# Patient Record
Sex: Female | Born: 1970 | Race: Black or African American | Hispanic: No | Marital: Married | Smoking: Never smoker
Health system: Southern US, Community
[De-identification: ages and names within clinical notes are randomized; demographics above are authoritative.]

## PROBLEM LIST (undated history)

## (undated) DIAGNOSIS — L309 Dermatitis, unspecified: Secondary | ICD-10-CM

## (undated) DIAGNOSIS — T7840XA Allergy, unspecified, initial encounter: Secondary | ICD-10-CM

## (undated) DIAGNOSIS — J45909 Unspecified asthma, uncomplicated: Secondary | ICD-10-CM

## (undated) DIAGNOSIS — E119 Type 2 diabetes mellitus without complications: Secondary | ICD-10-CM

## (undated) DIAGNOSIS — M549 Dorsalgia, unspecified: Secondary | ICD-10-CM

## (undated) HISTORY — DX: Dermatitis, unspecified: L30.9

## (undated) HISTORY — DX: Dorsalgia, unspecified: M54.9

## (undated) HISTORY — PX: CHOLECYSTECTOMY: SHX55

## (undated) HISTORY — DX: Type 2 diabetes mellitus without complications: E11.9

## (undated) HISTORY — PX: TUBAL LIGATION: SHX77

## (undated) HISTORY — DX: Allergy, unspecified, initial encounter: T78.40XA

## (undated) HISTORY — PX: BREAST BIOPSY: SHX20

## (undated) HISTORY — PX: ABDOMINAL HYSTERECTOMY: SHX81

---

## 2020-01-28 DIAGNOSIS — J4520 Mild intermittent asthma, uncomplicated: Secondary | ICD-10-CM | POA: Insufficient documentation

## 2020-12-09 DIAGNOSIS — E1165 Type 2 diabetes mellitus with hyperglycemia: Secondary | ICD-10-CM | POA: Insufficient documentation

## 2021-10-14 ENCOUNTER — Other Ambulatory Visit (HOSPITAL_COMMUNITY): Payer: Self-pay

## 2021-10-14 MED ORDER — OZEMPIC (0.25 OR 0.5 MG/DOSE) 2 MG/1.5ML ~~LOC~~ SOPN
PEN_INJECTOR | SUBCUTANEOUS | 3 refills | Status: DC
  Filled 2021-10-14: qty 4.5, 30d supply, fill #0
  Filled 2021-10-17 – 2021-11-22 (×3): qty 1.5, 28d supply, fill #0

## 2021-10-17 ENCOUNTER — Other Ambulatory Visit (HOSPITAL_COMMUNITY): Payer: Self-pay

## 2021-10-18 ENCOUNTER — Other Ambulatory Visit (HOSPITAL_COMMUNITY): Payer: Self-pay

## 2021-10-19 ENCOUNTER — Other Ambulatory Visit (HOSPITAL_COMMUNITY): Payer: Self-pay

## 2021-10-24 ENCOUNTER — Other Ambulatory Visit (HOSPITAL_COMMUNITY): Payer: Self-pay

## 2021-10-24 MED ORDER — GLIPIZIDE 5 MG PO TABS
ORAL_TABLET | ORAL | 3 refills | Status: DC
  Filled 2021-10-24: qty 45, 90d supply, fill #0

## 2021-10-25 ENCOUNTER — Other Ambulatory Visit (HOSPITAL_COMMUNITY): Payer: Self-pay

## 2021-10-26 ENCOUNTER — Other Ambulatory Visit (HOSPITAL_COMMUNITY): Payer: Self-pay

## 2021-10-29 ENCOUNTER — Other Ambulatory Visit (HOSPITAL_COMMUNITY): Payer: Self-pay

## 2021-11-05 ENCOUNTER — Other Ambulatory Visit (HOSPITAL_COMMUNITY): Payer: Self-pay

## 2021-11-08 ENCOUNTER — Other Ambulatory Visit (HOSPITAL_COMMUNITY): Payer: Self-pay

## 2021-11-22 ENCOUNTER — Other Ambulatory Visit (HOSPITAL_COMMUNITY): Payer: Self-pay

## 2021-11-23 ENCOUNTER — Other Ambulatory Visit: Payer: Self-pay

## 2021-11-23 ENCOUNTER — Other Ambulatory Visit (HOSPITAL_COMMUNITY): Payer: Self-pay

## 2021-11-23 MED ORDER — NAPROXEN 500 MG PO TABS
ORAL_TABLET | ORAL | 1 refills | Status: DC
  Filled 2021-11-23 (×2): qty 20, 10d supply, fill #0
  Filled 2022-04-05: qty 20, 10d supply, fill #1

## 2021-11-23 MED ORDER — AZITHROMYCIN 250 MG PO TABS
ORAL_TABLET | ORAL | 0 refills | Status: DC
  Filled 2021-11-23 (×2): qty 6, 5d supply, fill #0

## 2021-11-23 MED ORDER — CYCLOBENZAPRINE HCL 5 MG PO TABS
ORAL_TABLET | ORAL | 0 refills | Status: AC
  Filled 2021-11-23 (×2): qty 30, 10d supply, fill #0

## 2021-12-06 LAB — HM DIABETES EYE EXAM

## 2021-12-22 ENCOUNTER — Encounter: Payer: Self-pay | Admitting: Internal Medicine

## 2021-12-22 ENCOUNTER — Ambulatory Visit: Payer: 59 | Admitting: Internal Medicine

## 2021-12-22 ENCOUNTER — Other Ambulatory Visit: Payer: Self-pay

## 2021-12-22 VITALS — BP 132/86 | HR 110 | Temp 98.4°F | Resp 16 | Ht 64.0 in | Wt 199.1 lb

## 2021-12-22 DIAGNOSIS — B3731 Acute candidiasis of vulva and vagina: Secondary | ICD-10-CM

## 2021-12-22 DIAGNOSIS — Z1211 Encounter for screening for malignant neoplasm of colon: Secondary | ICD-10-CM | POA: Diagnosis not present

## 2021-12-22 DIAGNOSIS — E1165 Type 2 diabetes mellitus with hyperglycemia: Secondary | ICD-10-CM | POA: Diagnosis not present

## 2021-12-22 DIAGNOSIS — Z114 Encounter for screening for human immunodeficiency virus [HIV]: Secondary | ICD-10-CM

## 2021-12-22 DIAGNOSIS — Z1159 Encounter for screening for other viral diseases: Secondary | ICD-10-CM

## 2021-12-22 DIAGNOSIS — J302 Other seasonal allergic rhinitis: Secondary | ICD-10-CM

## 2021-12-22 DIAGNOSIS — E782 Mixed hyperlipidemia: Secondary | ICD-10-CM

## 2021-12-22 DIAGNOSIS — Z1231 Encounter for screening mammogram for malignant neoplasm of breast: Secondary | ICD-10-CM

## 2021-12-22 MED ORDER — FLUCONAZOLE 150 MG PO TABS
150.0000 mg | ORAL_TABLET | Freq: Once | ORAL | 0 refills | Status: AC
  Filled 2021-12-22: qty 3, 3d supply, fill #0

## 2021-12-22 MED ORDER — OZEMPIC (0.25 OR 0.5 MG/DOSE) 2 MG/1.5ML ~~LOC~~ SOPN
PEN_INJECTOR | SUBCUTANEOUS | 3 refills | Status: DC
  Filled 2021-12-22: qty 1.5, 28d supply, fill #0
  Filled 2022-02-02: qty 1.5, 28d supply, fill #1

## 2021-12-22 NOTE — Assessment & Plan Note (Signed)
Stable, continue current medications.  

## 2021-12-22 NOTE — Assessment & Plan Note (Signed)
Refilled Ozempic at 0.5 mg dose, no hypoglycemia. Plan to recheck A1c next week.  ?

## 2021-12-22 NOTE — Progress Notes (Signed)
? ?New Patient Office Visit ? ?Subjective:  ?Patient ID: Helen Rackracy Goucher, female    DOB: [birth year]  Age: 51 y.o. MRN: [id number] ? ?CC:  ?Chief Complaint  ?Patient presents with  ? Establish Care  ? Diabetes  ? Vaginitis  ?  On outside of vagina  ? ? ?HPI ?Helen Rodgers presents as a new patient.  ? ?Diabetes, Type 2: ?-First diagnosed about 1 year ago ?-Last A1c 12/22 5.4 ?-Medications: Ozempic 0.5, started in August 2022. Didn't tolerate Metformin or Glipizide (had a lot of abdominal pain and bloating). ?-Patient is compliant with the above medications and reports no side effects.  ?-Checking BG at home: post-prandial 120's usually. Lowest 90  ?-Exercise: walks, works out on elliptical  ?-Diet: hard boiled eggs, fruit, small portions  ?-Eye exam: Follows in [place], last exam 2 weeks  ?-Foot exam: UTD in December, 2022 ?-Microalbumin: UTD 7/22 ?-Statin: None ?-PNA vaccine: 23 in 2022 ?-Denies symptoms of hypoglycemia, polyuria, polydipsia, numbness extremities, foot ulcers/trauma.  ? ?HLD: ?-Medications: None ?-Last lipid panel: 12/22 TC 217, triglycerides 99, HDL 40, LDL 157  ? ?The 10-year ASCVD risk score (Arnett DK, et al., 2019) is: 7.3% ?  Values used to calculate the score: ?    Age: 20 years ?    Sex: Female ?    Is Non-Hispanic African American: Yes ?    Diabetic: Yes ?    Tobacco smoker: No ?    Systolic Blood Pressure: 132 mmHg ?    Is BP treated: No ?    HDL Cholesterol: 40 mg/dL ?    Total Cholesterol: 217 mg/dL ? ?Seasonal Allergies: ?-Currently taking Astelin daily, has Flonase PRN ?-Not on any oral anti-histamines ?-Does have associated vertigo with ear pressure, changes in hearing and tinnitus. Did see ENT ? ?Asthma:  ?-Asthma status: stable ?-Current Treatments: Albuterol PRN ?-Satisfied with current treatment?: yes ?-Albuterol/rescue inhaler frequency: maybe once a month  ?-Visits to ER or Urgent Care in past year: no ?-Pneumovax: Up to Date ?-Influenza: Up to Date ? ?Also having some vaginal pain  after intercourse, small amount of blood. Some itching on the vulvar skin, no change in vaginal discharge or vaginal pain.  ? ?Health Maintenance: ?-Blood work due  ?-Mammogram due  ?-Colon cancer screening due ? ?Past Medical History:  ?Diagnosis Date  ? Allergy   ? Back pain   ? Dermatitis of external ear   ? Diabetes mellitus without complication (HCC)   ? ? ?Past Surgical History:  ?Procedure Laterality Date  ? ABDOMINAL HYSTERECTOMY    ? CHOLECYSTECTOMY    ? TUBAL LIGATION    ? ? ?Family History  ?Problem Relation Age of Onset  ? Hyperlipidemia Mother   ? Diabetes Mother   ? Glaucoma Mother   ? Heart disease Mother   ? Hyperlipidemia Father   ? ? ?Social History  ? ?Socioeconomic History  ? Marital status: Married  ?  Spouse name: Not on file  ? Number of children: Not on file  ? Years of education: Not on file  ? Highest education level: Not on file  ?Occupational History  ? Not on file  ?Tobacco Use  ? Smoking status: Never  ? Smokeless tobacco: Never  ?Vaping Use  ? Vaping Use: Never used  ?Substance and Sexual Activity  ? Alcohol use: Yes  ?  Alcohol/week: 1.0 standard drink  ?  Types: 1 Glasses of wine per week  ?  Comment: occasional  ? Drug use: Never  ?  Sexual activity: Yes  ?Other Topics Concern  ? Not on file  ?Social History Narrative  ? Not on file  ? ?Social Determinants of Health  ? ?Financial Resource Strain: Not on file  ?Food Insecurity: Not on file  ?Transportation Needs: Not on file  ?Physical Activity: Not on file  ?Stress: Not on file  ?Social Connections: Not on file  ?Intimate Partner Violence: Not on file  ? ? ?ROS ?Review of Systems  ?Constitutional:  Negative for chills and fever.  ?Eyes:  Negative for visual disturbance.  ?Respiratory:  Negative for cough and shortness of breath.   ?Cardiovascular:  Negative for chest pain.  ?Gastrointestinal:  Negative for abdominal pain.  ?Genitourinary:  Negative for vaginal bleeding, vaginal discharge and vaginal pain.  ?Skin:  Positive for color  change.  ? ?Objective:  ? ?Today's Vitals: BP 132/86   Pulse (!) 110   Temp 98.4 ?F (36.9 ?C)   Resp 16   Ht 5\' 4"  (1.626 m)   Wt 199 lb 1.6 oz (90.3 kg)   SpO2 97%   BMI 34.18 kg/m?  ? ?Physical Exam ?Constitutional:   ?   Appearance: Normal appearance.  ?HENT:  ?   Head: Normocephalic and atraumatic.  ?Eyes:  ?   Conjunctiva/sclera: Conjunctivae normal.  ?Cardiovascular:  ?   Rate and Rhythm: Normal rate and regular rhythm.  ?Pulmonary:  ?   Effort: Pulmonary effort is normal.  ?   Breath sounds: Normal breath sounds.  ?Musculoskeletal:  ?   Right lower leg: No edema.  ?   Left lower leg: No edema.  ?Skin: ?   General: Skin is warm and dry.  ?Neurological:  ?   General: No focal deficit present.  ?   Mental Status: She is alert. Mental status is at baseline.  ?Psychiatric:     ?   Mood and Affect: Mood normal.     ?   Behavior: Behavior normal.  ? ? ?Assessment & Plan:  ? ?Problem List Items Addressed This Visit   ? ?  ? Endocrine  ? Type 2 diabetes mellitus with hyperglycemia, without long-term current use of insulin (HCC)  ?  Refilled Ozempic at 0.5 mg dose, no hypoglycemia. Plan to recheck A1c next week.  ?  ?  ? Relevant Medications  ? Semaglutide,0.25 or 0.5MG /DOS, (OZEMPIC, 0.25 OR 0.5 MG/DOSE,) 2 MG/1.5ML SOPN  ? Other Relevant Orders  ? HgB A1c  ?  ? Other  ? Mixed hyperlipidemia  ?  Reviewed last lipid panel with the patient, ASCVD risk borderline at 7.3%. Working on lifestyle changes, plan to recheck in 6 months.  ?  ?  ? Seasonal allergies  ?  Stable, continue current medications. ? ?  ?  ? ?Other Visit Diagnoses   ? ? Screening for colon cancer    -  Primary  ? Relevant Orders  ? Ambulatory referral to Gastroenterology  ? Encounter for hepatitis C screening test for low risk patient      ? Relevant Orders  ? Hepatitis C Antibody  ? Screening for HIV without presence of risk factors      ? Relevant Medications  ? fluconazole (DIFLUCAN) 150 MG tablet  ? Other Relevant Orders  ? HIV antibody (with  reflex)  ? Encounter for screening mammogram for malignant neoplasm of breast      ? Relevant Orders  ? MM Digital Screening  ? Vaginal yeast infection      ? Relevant Medications  ?  clotrimazole-betamethasone (LOTRISONE) cream  ? fluconazole (DIFLUCAN) 150 MG tablet  ? ?  ? ? ?Outpatient Encounter Medications as of 12/22/2021  ?Medication Sig  ? albuterol (VENTOLIN HFA) 108 (90 Base) MCG/ACT inhaler Inhale 1-2 puffs into the lungs every 6 (six) hours as needed.  ? azelastine (ASTELIN) 0.1 % nasal spray 1 spray into each nostril Two (2) times a day. Use in each nostril as directed  ? Chlorphen-PE-Acetaminophen 4-10-325 MG TABS Take by mouth.  ? Cholecalciferol 1.25 MG (50000 UT) capsule Monthly  ? clotrimazole-betamethasone (LOTRISONE) cream   ? cyclobenzaprine (FLEXERIL) 5 MG tablet Take 1 tablet (5 mg total) by mouth 3 (three) times daily as needed for Muscle spasms for up to 10 days  ? fluticasone (FLONASE) 50 MCG/ACT nasal spray 2 sprays.  ? ibuprofen (ADVIL) 600 MG tablet Take 600 mg by mouth every 6 (six) hours as needed.  ? naproxen (NAPROSYN) 500 MG tablet Take 1 tablet (500 mg total) by mouth 2 (two) times daily as needed for up to 10 days. Take with food.  ? Semaglutide,0.25 or 0.5MG /DOS, (OZEMPIC, 0.25 OR 0.5 MG/DOSE,) 2 MG/1.5ML SOPN Inject 0.5 mg under the skin every 7 days.  ? [DISCONTINUED] azithromycin (ZITHROMAX) 250 MG tablet Take 2 tablets on day one, then 1 tablet daily for 4 days  ? [DISCONTINUED] glipiZIDE (GLUCOTROL) 5 MG tablet Take 1/2 tablet (2.5 mg total) by mouth in the morning.  ? ?No facility-administered encounter medications on file as of 12/22/2021.  ? ? ?Follow-up: Return in about 6 months (around 06/24/2022).  ? ?Margarita Mail, DO ? ?

## 2021-12-22 NOTE — Patient Instructions (Addendum)
It was great seeing you today! ? ?Plan discussed at today's visit: ?-Blood work ordered today, results will be uploaded to MyChart. Please come next week to get labs.  ?-Continue to work on lifestyle management and exercise, decrease red meats and fatty processed foods. Plan to check cholesterol in 6 months, fast for 8-12 hours please.  ?-Take OTC Vitamin D at least 1000 IU daily  ?-Mammogram and GI referral placed  ?-Lab times 8-12 and then 2-4  ?-Diflucan sent to pharmacy, take 1 dose and can repeat dose in 24 hours if symptoms persist  ? ?Follow up in: 6 months  ? ?Take care and let us know if you have any questions or concerns prior to your next visit. ? ?Dr. Caralee Ates ? ?

## 2021-12-22 NOTE — Assessment & Plan Note (Signed)
Reviewed last lipid panel with the patient, ASCVD risk borderline at 7.3%. Working on lifestyle changes, plan to recheck in 6 months.  ?

## 2021-12-23 ENCOUNTER — Other Ambulatory Visit: Payer: Self-pay

## 2021-12-23 ENCOUNTER — Telehealth: Payer: Self-pay

## 2021-12-23 NOTE — Telephone Encounter (Signed)
CALLED PATIENT NO ANSWER LEFT VOICEMAIL FOR A CALL BACK ? ?

## 2021-12-26 ENCOUNTER — Other Ambulatory Visit: Payer: Self-pay

## 2021-12-26 DIAGNOSIS — Z1211 Encounter for screening for malignant neoplasm of colon: Secondary | ICD-10-CM

## 2021-12-26 MED ORDER — NA SULFATE-K SULFATE-MG SULF 17.5-3.13-1.6 GM/177ML PO SOLN
1.0000 | Freq: Once | ORAL | 0 refills | Status: AC
  Filled 2021-12-26: qty 354, 1d supply, fill #0

## 2021-12-26 NOTE — Progress Notes (Signed)
Gastroenterology Pre-Procedure Review ? ?Request Date: 07/21/2022 ?Requesting Physician: Dr. Marius Ditch ? ?PATIENT REVIEW QUESTIONS: The patient responded to the following health history questions as indicated:   ? ?1. Are you having any GI issues? no ?2. Do you have a personal history of Polyps? no ?3. Do you have a family history of Colon Cancer or Polyps? yes (COLON CANCER) ?4. Diabetes Mellitus? yes (TYPE 2) ?5. Joint replacements in the past 12 months?no ?6. Major health problems in the past 3 months?no ?7. Any artificial heart valves, MVP, or defibrillator?no ?   ?MEDICATIONS & ALLERGIES:    ?Patient reports the following regarding taking any anticoagulation/antiplatelet therapy:   ?Plavix, Coumadin, Eliquis, Xarelto, Lovenox, Pradaxa, Brilinta, or Effient? no ?Aspirin? yes (81MG ) ? ?Patient confirms/reports the following medications:  ?Current Outpatient Medications  ?Medication Sig Dispense Refill  ? albuterol (VENTOLIN HFA) 108 (90 Base) MCG/ACT inhaler Inhale 1-2 puffs into the lungs every 6 (six) hours as needed.    ? azelastine (ASTELIN) 0.1 % nasal spray 1 spray into each nostril Two (2) times a day. Use in each nostril as directed    ? Chlorphen-PE-Acetaminophen 4-10-325 MG TABS Take by mouth.    ? Cholecalciferol 1.25 MG (50000 UT) capsule Monthly    ? clotrimazole-betamethasone (LOTRISONE) cream     ? cyclobenzaprine (FLEXERIL) 5 MG tablet Take 1 tablet (5 mg total) by mouth 3 (three) times daily as needed for Muscle spasms for up to 10 days 30 tablet 0  ? fluconazole (DIFLUCAN) 150 MG tablet Take 1 tablet (150 mg total) by mouth once for 1 dose. 3 tablet 0  ? fluticasone (FLONASE) 50 MCG/ACT nasal spray 2 sprays.    ? ibuprofen (ADVIL) 600 MG tablet Take 600 mg by mouth every 6 (six) hours as needed.    ? naproxen (NAPROSYN) 500 MG tablet Take 1 tablet (500 mg total) by mouth 2 (two) times daily as needed for up to 10 days. Take with food. 20 tablet 1  ? Semaglutide,0.25 or 0.5MG /DOS, (OZEMPIC, 0.25 OR  0.5 MG/DOSE,) 2 MG/1.5ML SOPN Inject 0.5 mg under the skin every 7 days. 4.5 mL 3  ? ?No current facility-administered medications for this visit.  ? ? ?Patient confirms/reports the following allergies:  ?Allergies  ?Allergen Reactions  ? Sulfa Antibiotics Hives and Rash  ? Hydrocodone Other (See Comments)  ?  Vaginal swelling  ?Vaginal swelling  ?  ? ? ?No orders of the defined types were placed in this encounter. ? ? ?AUTHORIZATION INFORMATION ?Primary Insurance: ?1D#: ?Group #: ? ?Secondary Insurance: ?1D#: ?Group #: ? ?SCHEDULE INFORMATION: ?Date: 07/21/2022 ?Time: ?[place] ? ?

## 2021-12-27 ENCOUNTER — Telehealth: Payer: Self-pay

## 2021-12-27 NOTE — Telephone Encounter (Signed)
Resent my chart for patient ?

## 2022-01-02 LAB — HIV ANTIBODY (ROUTINE TESTING W REFLEX): HIV 1&2 Ab, 4th Generation: NONREACTIVE

## 2022-01-02 LAB — HEMOGLOBIN A1C
Hgb A1c MFr Bld: 6.1 % of total Hgb — ABNORMAL HIGH (ref <5.7)
Mean Plasma Glucose: 128 mg/dL
eAG (mmol/L): 7.1 mmol/L

## 2022-01-02 LAB — HEPATITIS C ANTIBODY
Hepatitis C Ab: NONREACTIVE
SIGNAL TO CUT-OFF: <0.02 (ref <1.00)

## 2022-01-04 ENCOUNTER — Ambulatory Visit: Payer: 59 | Admitting: Internal Medicine

## 2022-01-04 ENCOUNTER — Encounter: Payer: Self-pay | Admitting: Internal Medicine

## 2022-01-04 VITALS — BP 118/82 | HR 89 | Temp 97.9°F | Resp 16 | Ht 64.0 in | Wt 197.0 lb

## 2022-01-04 DIAGNOSIS — Z1231 Encounter for screening mammogram for malignant neoplasm of breast: Secondary | ICD-10-CM

## 2022-01-04 DIAGNOSIS — Z9889 Other specified postprocedural states: Secondary | ICD-10-CM

## 2022-01-04 DIAGNOSIS — N644 Mastodynia: Secondary | ICD-10-CM | POA: Diagnosis not present

## 2022-01-04 NOTE — Progress Notes (Signed)
? ?Acute Office Visit ? ?Subjective:  ? ? Patient ID: Helen Rodgers, female    DOB: Feb 23, 1971, 51 y.o.   MRN: [id number] ? ?Chief Complaint  ?Patient presents with  ? Breast Mass  ?  W/ sorness, has had abnormal mammograms in past  ? ? ?HPI ?Patient is in today for breast lump/pain. Left breast has been tender to the touch over the last 4-5 days. She first noticed it while holding her granddaughter and then later that night in the shower noticed tenderness to the touch in the left outer upper quadrant and increased fullness there. No freely mobile lumps noted. Does have a history of 3 biopsies starting in 2006 -2018 in the left breast. All which were negative and throughout other health systems, unable to review.  ? ?Breast Pain: ?-Duration : 4 days ago  ?-Location:  left upper quadrant ?-Onset: sudden ?-Severity: moderate ?-Quality: sore ?-Frequency: intermittent ?-Redness: no ?-Swelling: no ?-Trauma: no trauma ?-Breastfeeding: no ?-Associated with menstral cycle: no ?-Nipple discharge: no ?-Breast lump: no distinguished lump but fullness noted ?-Status: fluctuating ?-Treatments attempted: none ?-Previous mammogram:  2/21 at [facility] probably benign; stable probably benign findings in the inner left breast including suspected benign cluster of cysts/apocrine metaplasia at the 8:45 o'clock axis with recommendations of follow up US in 12 months. She recently moved and did have follow up imaging completed.  ? ? ?Past Medical History:  ?Diagnosis Date  ? Allergy   ? Back pain   ? Dermatitis of external ear   ? Diabetes mellitus without complication ([place])   ? ? ?Past Surgical History:  ?Procedure Laterality Date  ? ABDOMINAL HYSTERECTOMY    ? CHOLECYSTECTOMY    ? TUBAL LIGATION    ? ? ?Family History  ?Problem Relation Age of Onset  ? Hyperlipidemia Mother   ? Diabetes Mother   ? Glaucoma Mother   ? Heart disease Mother   ? Hyperlipidemia Father   ? ? ?Social History  ? ?Socioeconomic History  ? Marital status:  Married  ?  Spouse name: Not on file  ? Number of children: Not on file  ? Years of education: Not on file  ? Highest education level: Not on file  ?Occupational History  ? Not on file  ?Tobacco Use  ? Smoking status: Never  ? Smokeless tobacco: Never  ?Vaping Use  ? Vaping Use: Never used  ?Substance and Sexual Activity  ? Alcohol use: Yes  ?  Alcohol/week: 1.0 standard drink  ?  Types: 1 Glasses of wine per week  ?  Comment: occasional  ? Drug use: Never  ? Sexual activity: Yes  ?Other Topics Concern  ? Not on file  ?Social History Narrative  ? Not on file  ? ?Social Determinants of Health  ? ?Financial Resource Strain: Not on file  ?Food Insecurity: Not on file  ?Transportation Needs: Not on file  ?Physical Activity: Not on file  ?Stress: Not on file  ?Social Connections: Not on file  ?Intimate Partner Violence: Not on file  ? ? ?Outpatient Medications Prior to Visit  ?Medication Sig Dispense Refill  ? albuterol (VENTOLIN HFA) 108 (90 Base) MCG/ACT inhaler Inhale 1-2 puffs into the lungs every 6 (six) hours as needed.    ? azelastine (ASTELIN) 0.1 % nasal spray 1 spray into each nostril Two (2) times a day. Use in each nostril as directed    ? Chlorphen-PE-Acetaminophen 4-10-325 MG TABS Take by mouth.    ? Cholecalciferol 1.25 MG (  50000 UT) capsule Monthly    ? clotrimazole-betamethasone (LOTRISONE) cream     ? cyclobenzaprine (FLEXERIL) 5 MG tablet Take 1 tablet (5 mg total) by mouth 3 (three) times daily as needed for Muscle spasms for up to 10 days 30 tablet 0  ? fluticasone (FLONASE) 50 MCG/ACT nasal spray 2 sprays.    ? ibuprofen (ADVIL) 600 MG tablet Take 600 mg by mouth every 6 (six) hours as needed.    ? naproxen (NAPROSYN) 500 MG tablet Take 1 tablet (500 mg total) by mouth 2 (two) times daily as needed for up to 10 days. Take with food. 20 tablet 1  ? Semaglutide,0.25 or 0.5MG /DOS, (OZEMPIC, 0.25 OR 0.5 MG/DOSE,) 2 MG/1.5ML SOPN Inject 0.5 mg under the skin every 7 days. 4.5 mL 3  ? ?No  facility-administered medications prior to visit.  ? ? ?Allergies  ?Allergen Reactions  ? Cephalexin Rash  ? Sulfa Antibiotics Hives and Rash  ? Hydrocodone Other (See Comments)  ?  Vaginal swelling  ?Vaginal swelling  ?  ? Hydrocodone-Acetaminophen Itching and Swelling  ? Oxycodone Itching  ? Penicillins Hives, Itching, Other (See Comments) and Rash  ?  Vaginal swelling ?Vaginal swelling ?Vaginal swelling ?Vaginal swelling ?Vaginal swelling ?Vaginal swelling ?Vaginal swelling ?  ? ? ?Review of Systems  ?Constitutional:  Negative for chills and fever.  ?Skin: Negative.   ? ?   ?Objective:  ?  ?Physical Exam ?Constitutional:   ?   Appearance: Normal appearance.  ?HENT:  ?   Head: Normocephalic and atraumatic.  ?Eyes:  ?   Conjunctiva/sclera: Conjunctivae normal.  ?Cardiovascular:  ?   Rate and Rhythm: Normal rate and regular rhythm.  ?Pulmonary:  ?   Effort: Pulmonary effort is normal.  ?   Breath sounds: Normal breath sounds.  ?Chest:  ?Breasts: ?   Right: Tenderness present. No swelling, bleeding, inverted nipple, mass, nipple discharge or skin change.  ?   Left: Tenderness present. No swelling, bleeding, inverted nipple, mass, nipple discharge or skin change.  ?Lymphadenopathy:  ?   Upper Body:  ?   Right upper body: No supraclavicular, axillary or pectoral adenopathy.  ?   Left upper body: No supraclavicular, axillary or pectoral adenopathy.  ?Skin: ?   General: Skin is warm and dry.  ?Neurological:  ?   General: No focal deficit present.  ?   Mental Status: She is alert. Mental status is at baseline.  ?Psychiatric:     ?   Mood and Affect: Mood normal.     ?   Behavior: Behavior normal.  ? ? ?BP 118/82   Pulse 89   Temp 97.9 ?F (36.6 ?C)   Resp 16   Ht 5\' 4"  (1.626 m)   Wt 197 lb (89.4 kg)   SpO2 99%   BMI 33.81 kg/m?  ?Wt Readings from Last 3 Encounters:  ?12/22/21 199 lb 1.6 oz (90.3 kg)  ? ? ?Health Maintenance Due  ?Topic Date Due  ? FOOT EXAM  Never done  ? OPHTHALMOLOGY EXAM  Never done  ? URINE  MICROALBUMIN  Never done  ? COLONOSCOPY (Pts 45-15yrs Insurance coverage will need to be confirmed)  Never done  ? COVID-19 Vaccine (3 - Booster for Pfizer series) 12/21/2019  ? ? ?There are no preventive care reminders to display for this patient. ? ? ?No results found for: TSH ?No results found for: WBC, HGB, HCT, MCV, PLT ?No results found for: NA, K, CHLORIDE, CO2, GLUCOSE, BUN, CREATININE, BILITOT, ALKPHOS, AST,  ALT, PROT, ALBUMIN, CALCIUM, ANIONGAP, EGFR, GFR ?No results found for: CHOL ?No results found for: HDL ?No results found for: [place] ?No results found for: TRIG ?No results found for: CHOLHDL ?Lab Results  ?Component Value Date  ? HGBA1C 6.1 (H) 12/30/2021  ? ? ?   ?Assessment & Plan:  ? ?1. Breast tenderness/Encounter for screening mammogram for malignant neoplasm of breast/History of left breast biopsy: No lumps appreciated on exam but is having bilateral breast tenderness. Due for diagnostic mammogram for routine screening, will also obtain bilateral ultrasounds as well for further assessment.  ? ?- MM DIAG BREAST TOMO BILATERAL; Future ?- US BREAST LTD UNI LEFT INC AXILLA; Future ?- US BREAST LTD UNI RIGHT INC AXILLA; Future ? ? ?Teodora Medici, DO ? ?

## 2022-01-04 NOTE — Patient Instructions (Addendum)
It was great seeing you today! ? ?Plan discussed at today's visit: ?-Diagnostic mammogram and bilateral breast ultrasound ordered today, call to set these up ? ?Follow up in: already scheduled in September  ? ?Take care and let us know if you have any questions or concerns prior to your next visit. ? ?Dr. Rosana Berger ? ?

## 2022-01-06 ENCOUNTER — Other Ambulatory Visit: Payer: Self-pay | Admitting: *Deleted

## 2022-01-06 ENCOUNTER — Inpatient Hospital Stay
Admission: RE | Admit: 2022-01-06 | Discharge: 2022-01-06 | Disposition: A | Discharge status: Home / Self Care | Payer: Self-pay | Source: Ambulatory Visit | Attending: *Deleted | Admitting: *Deleted

## 2022-01-06 DIAGNOSIS — Z1231 Encounter for screening mammogram for malignant neoplasm of breast: Secondary | ICD-10-CM

## 2022-01-13 ENCOUNTER — Ambulatory Visit: Payer: 59 | Admitting: Internal Medicine

## 2022-02-02 ENCOUNTER — Other Ambulatory Visit: Payer: Self-pay

## 2022-02-03 ENCOUNTER — Ambulatory Visit
Admission: RE | Admit: 2022-02-03 | Discharge: 2022-02-03 | Disposition: A | Discharge status: Home / Self Care | Payer: 59 | Source: Ambulatory Visit | Attending: Internal Medicine | Admitting: Internal Medicine

## 2022-02-03 DIAGNOSIS — Z1231 Encounter for screening mammogram for malignant neoplasm of breast: Secondary | ICD-10-CM | POA: Diagnosis present

## 2022-02-03 DIAGNOSIS — N644 Mastodynia: Secondary | ICD-10-CM | POA: Diagnosis not present

## 2022-02-03 DIAGNOSIS — Z9889 Other specified postprocedural states: Secondary | ICD-10-CM

## 2022-02-09 ENCOUNTER — Ambulatory Visit: Payer: Self-pay

## 2022-02-09 NOTE — Telephone Encounter (Signed)
?  Chief Complaint: sinus infection ?Symptoms: congestion, runny nose, cough, sore throat, ear pain and drainage, SOB ?Frequency: since Monday ?Pertinent Negatives: NA ?Disposition: [] ED /[] Urgent Care (no appt availability in office) / [x] Appointment(In office/virtual)/ []  [facility]/ [] Home Care/ [] Refused Recommended Disposition /[] [facility]/ []  Follow-up with PCP ?Additional Notes: pt has bad history of walking PNA and is afraid this will turn into that. Attempted to schedule appt for today at 1540 but pt unable to come in so scheduled for 1340 tomorrow and advised her if she gets worse and needs to switch to morning appt to call back. Pt verbalized understanding.  ? ?Reason for Disposition ? Ear pain ? ?Answer Assessment - Initial Assessment Questions ?1. LOCATION: "Which ear is involved?"  ?    R ear  ?4. ONSET: "When did you first notice the discharge?" ?    Since Monday ?5. PAIN: "Is there any earache?" "How bad is it?"  (Scale 1-10; or mild, moderate, severe) ?    Mild  ?7. OTHER SYMPTOMS: "Do you have any other symptoms?" (e.g., headache, fever, dizziness, vomiting, runny nose) ?    Sore throat, runny nose, congestion ? ?Protocols used: Ear - Discharge-A-AH ? ?

## 2022-02-10 ENCOUNTER — Ambulatory Visit (INDEPENDENT_AMBULATORY_CARE_PROVIDER_SITE_OTHER): Payer: 59 | Admitting: Family Medicine

## 2022-02-10 ENCOUNTER — Encounter: Payer: Self-pay | Admitting: Family Medicine

## 2022-02-10 VITALS — BP 118/80 | HR 98 | Temp 98.1°F | Resp 16 | Ht 64.0 in | Wt 196.7 lb

## 2022-02-10 DIAGNOSIS — J329 Chronic sinusitis, unspecified: Secondary | ICD-10-CM | POA: Diagnosis not present

## 2022-02-10 DIAGNOSIS — J310 Chronic rhinitis: Secondary | ICD-10-CM | POA: Diagnosis not present

## 2022-02-10 MED ORDER — LEVOCETIRIZINE DIHYDROCHLORIDE 5 MG PO TABS: 5.0000 mg | ORAL_TABLET | Freq: Every evening | ORAL | 1 refills | Status: DC

## 2022-02-10 MED ORDER — DOXYCYCLINE HYCLATE 100 MG PO TABS: 100.0000 mg | ORAL_TABLET | Freq: Two times a day (BID) | ORAL | 0 refills | Status: AC

## 2022-02-10 NOTE — Patient Instructions (Signed)
Try taking claritin-D in the am and the xyzal at bedtime.   ?Continue your current nasal sprays ?You can also do some saline nasal spray ? ?If you don't feel any better then stop the D- decongestant and start the doxycycline ? ?I recommend you follow up with [facility] ? ?

## 2022-02-10 NOTE — Progress Notes (Signed)
? ? ?Patient ID: Helen Rodgers, female    DOB: [name], 51 y.o.   MRN: [id number] ? ?PCP: Margarita MailAndrews, Elisabeth, DO ? ?Chief Complaint  ?Patient presents with  ? Sinusitis  ? ? ?Subjective:  ? ?Helen Rodgers is a 51 y.o. female, presents to clinic with CC of the following: ? ?HPI  ?Patient presents for recurrent sinusitis she reports getting antibiotics 2 times in the last month ?Per chart review I can see Helen Rodgers walk-in clinic visit on 11/23/21 with zpak and no other abx of OV for the same ?She reports getting an infection around this time a year ?She is using 2 different nasal sprays is not on any oral allergy medications.  No improvement with over-the-counter medications ?She is having moderate to severe sinus pressure across her cheeks and around and behind her eyes.  She has had some postnasal drip and sore throat.  No fever, neck pain, N, V, body aches.  ? ?Patient Active Problem List  ? Diagnosis Date Noted  ? Mixed hyperlipidemia 12/22/2021  ? Seasonal allergies 12/22/2021  ? Type 2 diabetes mellitus with hyperglycemia, without long-term current use of insulin (HCC) 12/22/2021  ? Type 2 diabetes mellitus with hyperglycemia, without long-term current use of insulin (HCC) 12/09/2020  ? Mild intermittent asthma without complication 01/28/2020  ? ? ? ? ?Current Outpatient Medications:  ?  albuterol (VENTOLIN HFA) 108 (90 Base) MCG/ACT inhaler, Inhale 1-2 puffs into the lungs every 6 (six) hours as needed., Disp: , Rfl:  ?  azelastine (ASTELIN) 0.1 % nasal spray, 1 spray into each nostril Two (2) times a day. Use in each nostril as directed, Disp: , Rfl:  ?  Chlorphen-PE-Acetaminophen 4-10-325 MG TABS, Take by mouth., Disp: , Rfl:  ?  Cholecalciferol 1.25 MG (50000 UT) capsule, Monthly, Disp: , Rfl:  ?  clotrimazole-betamethasone (LOTRISONE) cream, , Disp: , Rfl:  ?  cyclobenzaprine (FLEXERIL) 5 MG tablet, Take 1 tablet (5 mg total) by mouth 3 (three) times daily as needed for Muscle spasms for up to 10 days, Disp:  30 tablet, Rfl: 0 ?  doxycycline (VIBRA-TABS) 100 MG tablet, Take 1 tablet (100 mg total) by mouth 2 (two) times daily for 7 days., Disp: 14 tablet, Rfl: 0 ?  fluticasone (FLONASE) 50 MCG/ACT nasal spray, 2 sprays., Disp: , Rfl:  ?  ibuprofen (ADVIL) 600 MG tablet, Take 600 mg by mouth every 6 (six) hours as needed., Disp: , Rfl:  ?  levocetirizine (XYZAL) 5 MG tablet, Take 1 tablet (5 mg total) by mouth every evening., Disp: 30 tablet, Rfl: 1 ?  naproxen (NAPROSYN) 500 MG tablet, Take 1 tablet (500 mg total) by mouth 2 (two) times daily as needed for up to 10 days. Take with food., Disp: 20 tablet, Rfl: 1 ?  Semaglutide,0.25 or 0.5MG /DOS, (OZEMPIC, 0.25 OR 0.5 MG/DOSE,) 2 MG/1.5ML SOPN, Inject 0.5 mg under the skin every 7 days., Disp: 4.5 mL, Rfl: 3 ? ? ?Allergies  ?Allergen Reactions  ? Cephalexin Rash  ? Sulfa Antibiotics Hives and Rash  ? Hydrocodone Other (See Comments)  ?  Vaginal swelling  ?Vaginal swelling  ?  ? Hydrocodone-Acetaminophen Itching and Swelling  ? Oxycodone Itching  ? Penicillins Hives, Itching, Other (See Comments) and Rash  ?  Vaginal swelling ?Vaginal swelling ?Vaginal swelling ?Vaginal swelling ?Vaginal swelling ?Vaginal swelling ?Vaginal swelling ?  ? ? ? ?Social History  ? ?Tobacco Use  ? Smoking status: Never  ? Smokeless tobacco: Never  ?Vaping Use  ? Vaping Use:  Never used  ?Substance Use Topics  ? Alcohol use: Yes  ?  Alcohol/week: 1.0 standard drink  ?  Types: 1 Glasses of wine per week  ?  Comment: occasional  ? Drug use: Never  ?  ? ? ?Chart Review Today: ?I personally reviewed active problem list, medication list, allergies, family history, social history, health maintenance, notes from last encounter, lab results, imaging with the patient/caregiver today. ? ? ?Review of Systems  ?Constitutional: Negative.   ?HENT: Negative.    ?Eyes: Negative.   ?Respiratory: Negative.    ?Cardiovascular: Negative.   ?Gastrointestinal: Negative.   ?Endocrine: Negative.   ?Genitourinary:  Negative.   ?Musculoskeletal: Negative.   ?Skin: Negative.   ?Allergic/Immunologic: Negative.   ?Neurological: Negative.   ?Hematological: Negative.   ?Psychiatric/Behavioral: Negative.    ?All other systems reviewed and are negative. ? ?   ?Objective:  ? ?Vitals:  ? 02/10/22 1342  ?BP: 118/80  ?Pulse: 98  ?Resp: 16  ?Temp: 98.1 ?F (36.7 ?C)  ?TempSrc: Oral  ?SpO2: 99%  ?Weight: 196 lb 11.2 oz (89.2 kg)  ?Height: 5\' 4"  (1.626 m)  ?  ?Body mass index is 33.76 kg/m?. ? ?Physical Exam ?Vitals and nursing note reviewed.  ?Constitutional:   ?   General: She is not in acute distress. ?   Appearance: Normal appearance. She is well-developed. She is not ill-appearing, toxic-appearing or diaphoretic.  ?HENT:  ?   Head: Normocephalic and atraumatic.  ?   Right Ear: Hearing, tympanic membrane, ear canal and external ear normal.  ?   Left Ear: Hearing, tympanic membrane, ear canal and external ear normal.  ?   Nose: Mucosal edema, congestion and rhinorrhea present.  ?   Right Turbinates: Enlarged and swollen.  ?   Left Turbinates: Enlarged and swollen.  ?   Right Sinus: Maxillary sinus tenderness present. No frontal sinus tenderness.  ?   Left Sinus: Maxillary sinus tenderness present. No frontal sinus tenderness.  ?   Mouth/Throat:  ?   Mouth: Mucous membranes are moist. Mucous membranes are not pale.  ?   Pharynx: Oropharynx is clear. Uvula midline. No oropharyngeal exudate, posterior oropharyngeal erythema or uvula swelling.  ?   Tonsils: No tonsillar abscesses.  ?Eyes:  ?   General:     ?   Right eye: No discharge.     ?   Left eye: No discharge.  ?   Conjunctiva/sclera: Conjunctivae normal.  ?   Pupils: Pupils are equal, round, and reactive to light.  ?Neck:  ?   Trachea: No tracheal deviation.  ?Cardiovascular:  ?   Rate and Rhythm: Normal rate and regular rhythm.  ?   Pulses: Normal pulses.  ?   Heart sounds: Normal heart sounds.  ?Pulmonary:  ?   Effort: Pulmonary effort is normal. No respiratory distress.  ?   Breath  sounds: Normal breath sounds. No stridor. No wheezing, rhonchi or rales.  ?Musculoskeletal:  ?   Cervical back: Normal range of motion and neck supple.  ?Skin: ?   General: Skin is warm and dry.  ?   Coloration: Skin is not pale.  ?   Findings: No rash.  ?Neurological:  ?   Mental Status: She is alert.  ?   Motor: No abnormal muscle tone.  ?Psychiatric:     ?   Behavior: Behavior normal. Behavior is cooperative.  ?  ? ?Results for orders placed or performed in visit on 12/22/21  ?HIV antibody (with reflex)  ?Result  Value Ref Range  ? HIV 1&2 Ab, 4th Generation NON-REACTIVE NON-REACTIVE  ?Hepatitis C Antibody  ?Result Value Ref Range  ? Hepatitis C Ab NON-REACTIVE NON-REACTIVE  ? SIGNAL TO CUT-OFF <0.02 <1.00  ?HgB A1c  ?Result Value Ref Range  ? Hgb A1c MFr Bld 6.1 (H) <5.7 % of total Hgb  ? Mean Plasma Glucose 128 mg/dL  ? eAG (mmol/L) 7.1 mmol/L  ? ? ?   ?Assessment & Plan:  ? ? ?1. Rhinosinusitis ?Patient reports recurrent sinusitis and multiple antibiotics in the last month I cannot find this in the chart since last antibiotics were about 3 months ago ?She does have very edematous nasal mucosa and enlarged nasal turbinates bilaterally very congested, very tender to maxillary sinus that is bilaterally ?Encouraged her to add a oral antihistamine and continue her to nasal sprays can also try saline nasal spray and decongestants if not improving then she was instructed to stop decongestants and start the antibiotic and follow-up with ENT ?- levocetirizine (XYZAL) 5 MG tablet; Take 1 tablet (5 mg total) by mouth every evening.  Dispense: 30 tablet; Refill: 1 ?- doxycycline (VIBRA-TABS) 100 MG tablet; Take 1 tablet (100 mg total) by mouth 2 (two) times daily for 7 days.  Dispense: 14 tablet; Refill: 0 ? ?- Ambulatory referral to ENT ? ? ? ? ?Danelle Berry, PA-C ?02/10/22 5:03 PM ? ?

## 2022-03-09 ENCOUNTER — Other Ambulatory Visit: Payer: Self-pay

## 2022-03-09 MED ORDER — OZEMPIC (0.25 OR 0.5 MG/DOSE) 2 MG/3ML ~~LOC~~ SOPN
0.5000 mg | PEN_INJECTOR | SUBCUTANEOUS | 9 refills | Status: DC
  Filled 2022-03-09: qty 3, 28d supply, fill #0
  Filled 2022-04-05: qty 3, 28d supply, fill #1
  Filled 2022-04-29: qty 3, 28d supply, fill #2
  Filled 2022-05-30: qty 3, 28d supply, fill #3

## 2022-03-31 ENCOUNTER — Other Ambulatory Visit (HOSPITAL_COMMUNITY): Payer: Self-pay

## 2022-04-06 ENCOUNTER — Other Ambulatory Visit: Payer: Self-pay

## 2022-04-29 ENCOUNTER — Other Ambulatory Visit: Payer: Self-pay | Admitting: Internal Medicine

## 2022-05-01 ENCOUNTER — Other Ambulatory Visit (HOSPITAL_COMMUNITY): Payer: Self-pay

## 2022-05-01 MED ORDER — IBUPROFEN 600 MG PO TABS
600.0000 mg | ORAL_TABLET | Freq: Four times a day (QID) | ORAL | 0 refills | Status: DC | PRN
  Filled 2022-05-01: qty 30, 8d supply, fill #0

## 2022-05-30 ENCOUNTER — Other Ambulatory Visit (HOSPITAL_COMMUNITY): Payer: Self-pay

## 2022-05-31 ENCOUNTER — Other Ambulatory Visit (HOSPITAL_COMMUNITY): Payer: Self-pay

## 2022-06-23 ENCOUNTER — Encounter: Payer: Self-pay | Admitting: Internal Medicine

## 2022-06-23 ENCOUNTER — Ambulatory Visit: Payer: Self-pay | Admitting: Internal Medicine

## 2022-06-23 VITALS — BP 116/62 | HR 77 | Temp 97.4°F | Resp 18 | Ht 64.0 in | Wt 199.9 lb

## 2022-06-23 DIAGNOSIS — E782 Mixed hyperlipidemia: Secondary | ICD-10-CM

## 2022-06-23 DIAGNOSIS — E1165 Type 2 diabetes mellitus with hyperglycemia: Secondary | ICD-10-CM

## 2022-06-23 DIAGNOSIS — Z23 Encounter for immunization: Secondary | ICD-10-CM

## 2022-06-23 DIAGNOSIS — F419 Anxiety disorder, unspecified: Secondary | ICD-10-CM

## 2022-06-23 MED ORDER — HYDROXYZINE HCL 10 MG PO TABS: 10.0000 mg | ORAL_TABLET | Freq: Three times a day (TID) | ORAL | 0 refills | Status: DC | PRN

## 2022-06-23 MED ORDER — SEMAGLUTIDE (1 MG/DOSE) 4 MG/3ML ~~LOC~~ SOPN
1.0000 mg | PEN_INJECTOR | SUBCUTANEOUS | 2 refills | Status: DC
  Filled 2022-07-06 – 2022-07-18 (×2): qty 3, 28d supply, fill #0

## 2022-06-23 NOTE — Progress Notes (Signed)
Established Patient Office Visit  Subjective:  Patient ID: Helen Rodgers, female    DOB: 16-Oct-1970  Age: 51 y.o. MRN: [id number]  CC:  Chief Complaint  Patient presents with   Follow-up    6 month f/u   Diabetes    HPI Helen Rodgers presents for follow up on chronic medical conditions.   Patient states since last time she was here she has had 2 deaths in her family.  Since then her anxiety has been exacerbated.  She does have symptoms of overwhelming sadness, grief, increased heart rate, shortness of breath.  She is not taking any medications for depression or anxiety.  Diabetes, Type 2: -First diagnosed in 2022 -Last A1c 6.1% 3/23 -Medications: Ozempic 0.5 mg, started in August 2022.  Failed Meds: Metformin or Glipizide (had a lot of abdominal pain and bloating). -Patient is compliant with the above medications and reports no side effects.  -Checking BG at home: post-prandial 120's usually. Lowest 90  -Exercise: walks, works out on elliptical  -Diet: hard boiled eggs, fruit, small portions  -Eye exam: Follows in [place], UTD 3/23 -Foot exam: UTD in December, 2022 -Microalbumin: Due -Statin: None -PNA vaccine: 23 in 2022 -Denies symptoms of hypoglycemia, polyuria, polydipsia, numbness extremities, foot ulcers/trauma.   HLD: -Medications: None -Last lipid panel: 12/22 TC 217, triglycerides 99, HDL 40, LDL 157   The 10-year ASCVD risk score (Arnett DK, et al., 2019) is: 4.3%   Values used to calculate the score:     Age: 61 years     Sex: Female     Is Non-Hispanic African American: Yes     Diabetic: Yes     Tobacco smoker: No     Systolic Blood Pressure: 633 mmHg     Is BP treated: No     HDL Cholesterol: 40 mg/dL     Total Cholesterol: 217 mg/dL  Seasonal Allergies: -Currently taking Astelin daily, has Flonase PRN -Not on any oral anti-histamines -Does have associated vertigo with ear pressure, changes in hearing and tinnitus. Did see ENT  Asthma:  -Asthma  status: stable -Current Treatments: Albuterol PRN -Satisfied with current treatment?: yes -Albuterol/rescue inhaler frequency: maybe once a month  -Visits to ER or Urgent Care in past year: no -Pneumovax: Up to Date -Influenza: Up to Date  Health Maintenance: -Blood work due  -Mammogram 5/23 - Birads-2  -Colon cancer screening due - apt. First of the year   Past Medical History:  Diagnosis Date   Allergy    Back pain    Dermatitis of external ear    Diabetes mellitus without complication ([place])     Past Surgical History:  Procedure Laterality Date   ABDOMINAL HYSTERECTOMY     BREAST BIOPSY Left    CHOLECYSTECTOMY     TUBAL LIGATION      Family History  Problem Relation Age of Onset   Hyperlipidemia Mother    Diabetes Mother    Glaucoma Mother    Heart disease Mother    Hyperlipidemia Father    Breast cancer Paternal Aunt     Social History   Socioeconomic History   Marital status: Married    Spouse name: Not on file   Number of children: Not on file   Years of education: Not on file   Highest education level: Not on file  Occupational History   Not on file  Tobacco Use   Smoking status: Never   Smokeless tobacco: Never  Vaping Use   Vaping Use: Never  used  Substance and Sexual Activity   Alcohol use: Yes    Alcohol/week: 1.0 standard drink of alcohol    Types: 1 Glasses of wine per week    Comment: occasional   Drug use: Never   Sexual activity: Yes  Other Topics Concern   Not on file  Social History Narrative   Not on file   Social Determinants of Health   Financial Resource Strain: Not on file  Food Insecurity: Not on file  Transportation Needs: Not on file  Physical Activity: Not on file  Stress: Not on file  Social Connections: Not on file  Intimate Partner Violence: Not on file    ROS Review of Systems  Constitutional:  Negative for chills and fever.  Eyes:  Negative for visual disturbance.  Respiratory:  Negative for cough and  shortness of breath.   Cardiovascular:  Negative for chest pain.  Gastrointestinal:  Negative for abdominal pain.  Psychiatric/Behavioral:  The patient is nervous/anxious.     Objective:   Today's Vitals: BP 116/62   Pulse 77   Temp (!) 97.4 F (36.3 C)   Resp 18   Ht 5\' 4"  (1.626 m)   Wt 199 lb 14.4 oz (90.7 kg)   SpO2 98%   BMI 34.31 kg/m   Physical Exam Constitutional:      Appearance: Normal appearance.  HENT:     Head: Normocephalic and atraumatic.  Eyes:     Conjunctiva/sclera: Conjunctivae normal.  Cardiovascular:     Rate and Rhythm: Normal rate and regular rhythm.  Pulmonary:     Effort: Pulmonary effort is normal.     Breath sounds: Normal breath sounds.  Musculoskeletal:     Right lower leg: No edema.     Left lower leg: No edema.  Skin:    General: Skin is warm and dry.  Neurological:     General: No focal deficit present.     Mental Status: She is alert. Mental status is at baseline.  Psychiatric:        Mood and Affect: Mood normal.        Behavior: Behavior normal.     Assessment & Plan:   1. Type 2 diabetes mellitus with hyperglycemia, without long-term current use of insulin [facility]): Due for routine labs including CBC, CMP, recheck A1c.  Microalbumin due today as well.  We will increase Ozempic dose to 1 mg to help with weight loss.  Patient will follow-up in 2 months for weight check to determine next Ozempic dose.  - Urine Microalbumin w/creat. ratio - HgB A1c - CBC w/Diff/Platelet - COMPLETE METABOLIC PANEL WITH GFR - Semaglutide, 1 MG/DOSE, 4 MG/3ML SOPN; Inject 1 mg as directed once a week.  Dispense: 3 mL; Refill: 2  2. Anxiety: Will treat with hydroxyzine 10 mg to use as needed for anxiety.  - hydrOXYzine (ATARAX) 10 MG tablet; Take 1 tablet (10 mg total) by mouth 3 (three) times daily as needed.  Dispense: 30 tablet; Refill: 0  3. Mixed hyperlipidemia: Recheck fasting lipid panel today.  - Lipid Profile  4. Need for influenza  vaccination: Flu vaccine administered today.  - Flu Vaccine QUAD 6+ mos PF IM (Fluarix Quad PF)  Follow-up: Return in about 2 months (around 08/23/2022).   Teodora Medici, DO

## 2022-06-23 NOTE — Patient Instructions (Signed)
It was great seeing you today!  Plan discussed at today's visit: -Blood work ordered today, results will be uploaded to [place] medication sent to the pharmacy to use as needed - can make you sleepy -Ozempic increased today -Flu vaccine today   Follow up in: 2 months   Take care and let us know if you have any questions or concerns prior to your next visit.  Dr. Rosana Berger

## 2022-06-24 LAB — CBC WITH DIFFERENTIAL/PLATELET
Absolute Monocytes: 539 cells/uL (ref 200–950)
Basophils Absolute: 19 cells/uL (ref 0–200)
Basophils Relative: 0.2 %
Eosinophils Absolute: 158 cells/uL (ref 15–500)
Eosinophils Relative: 1.7 %
HCT: 41 % (ref 35.0–45.0)
Hemoglobin: 13.6 g/dL (ref 11.7–15.5)
Lymphs Abs: 4120 cells/uL — ABNORMAL HIGH (ref 850–3900)
MCH: 31.3 pg (ref 27.0–33.0)
MCHC: 33.2 g/dL (ref 32.0–36.0)
MCV: 94.5 fL (ref 80.0–100.0)
MPV: 9.3 fL (ref 7.5–12.5)
Monocytes Relative: 5.8 %
Neutro Abs: 4464 cells/uL (ref 1500–7800)
Neutrophils Relative %: 48 %
Platelets: 327 10*3/uL (ref 140–400)
RBC: 4.34 10*6/uL (ref 3.80–5.10)
RDW: 12.3 % (ref 11.0–15.0)
Total Lymphocyte: 44.3 %
WBC: 9.3 10*3/uL (ref 3.8–10.8)

## 2022-06-24 LAB — COMPLETE METABOLIC PANEL WITH GFR
AG Ratio: 1.2 (calc) (ref 1.0–2.5)
ALT: 21 U/L (ref 6–29)
AST: 20 U/L (ref 10–35)
Albumin: 4.3 g/dL (ref 3.6–5.1)
Alkaline phosphatase (APISO): 91 U/L (ref 31–125)
BUN: 7 mg/dL (ref 7–25)
CO2: 28 mmol/L (ref 20–32)
Calcium: 9 mg/dL (ref 8.6–10.2)
Chloride: 104 mmol/L (ref 98–110)
Creat: 0.62 mg/dL (ref 0.50–0.99)
Globulin: 3.5 g/dL (calc) (ref 1.9–3.7)
Glucose, Bld: 85 mg/dL (ref 65–99)
Potassium: 3.7 mmol/L (ref 3.5–5.3)
Sodium: 141 mmol/L (ref 135–146)
Total Bilirubin: 0.6 mg/dL (ref 0.2–1.2)
Total Protein: 7.8 g/dL (ref 6.1–8.1)
eGFR: 110 mL/min/{1.73_m2} (ref >=60)

## 2022-06-24 LAB — MICROALBUMIN / CREATININE URINE RATIO
Creatinine, Urine: 23 mg/dL (ref 20–275)
Microalb, Ur: <0.2 mg/dL

## 2022-06-24 LAB — LIPID PANEL
Cholesterol: 223 mg/dL — ABNORMAL HIGH (ref <200)
HDL: 53 mg/dL (ref >=50)
LDL Cholesterol (Calc): 149 mg/dL (calc) — ABNORMAL HIGH
Non-HDL Cholesterol (Calc): 170 mg/dL (calc) — ABNORMAL HIGH (ref <130)
Total CHOL/HDL Ratio: 4.2 (calc) (ref <5.0)
Triglycerides: 99 mg/dL (ref <150)

## 2022-06-24 LAB — HEMOGLOBIN A1C
Hgb A1c MFr Bld: 5.7 % of total Hgb — ABNORMAL HIGH (ref <5.7)
Mean Plasma Glucose: 117 mg/dL
eAG (mmol/L): 6.5 mmol/L

## 2022-07-04 ENCOUNTER — Other Ambulatory Visit: Payer: Self-pay

## 2022-07-06 ENCOUNTER — Other Ambulatory Visit: Payer: Self-pay

## 2022-07-09 ENCOUNTER — Other Ambulatory Visit: Payer: Self-pay

## 2022-07-10 ENCOUNTER — Other Ambulatory Visit: Payer: Self-pay

## 2022-07-18 ENCOUNTER — Other Ambulatory Visit: Payer: Self-pay

## 2022-07-19 ENCOUNTER — Other Ambulatory Visit: Payer: Self-pay

## 2022-07-21 ENCOUNTER — Ambulatory Visit: Admit: 2022-07-21 | Payer: Self-pay | Admitting: Gastroenterology

## 2022-07-21 SURGERY — COLONOSCOPY WITH PROPOFOL
Anesthesia: General

## 2022-08-01 ENCOUNTER — Other Ambulatory Visit: Payer: Self-pay

## 2022-08-17 ENCOUNTER — Other Ambulatory Visit: Payer: Self-pay

## 2022-08-22 ENCOUNTER — Ambulatory Visit: Payer: Self-pay | Admitting: Internal Medicine

## 2022-09-07 NOTE — Progress Notes (Signed)
Established Patient Office Visit  Subjective:  Patient ID: Helen Rodgers, female    DOB: [birth year]  Age: 51 y.o. MRN: [id number]  CC:  Chief Complaint  Patient presents with   Follow-up   Weight Check    HPI Helen Rodgers presents for follow up on chronic medical conditions.    Situational Anxiety: -At LOV we discussed how there had been multiple deaths in her family recently. At the time she was experiencing overwhelming sadness, grief, increased heart rate, shortness of breath.   -She was started on Hydroxyzine PRN for anxiety and only has had to take 3 times, overall feeling stable.  Diabetes, Type 2: -First diagnosed in 2022 -Last A1c 5.7% 9/23 -Medications: Ozempic 1 mg increased at LOV - today stating that she has a decreased appetite but has not lost any weight. She feels like she is snacking and grazing throughout the day without eating much at meals. She also has occasional nausea, no vomiting. She held her Ozempic dose this week and symptoms have not improved.  Failed Meds: Metformin or Glipizide (had a lot of abdominal pain and bloating). -Patient is compliant with the above medications and reports no side effects.  -Checking BG at home: post-prandial 120's usually. Lowest 90  -Exercise: walks, works out on elliptical  -Diet: hard boiled eggs, fruit, small portions  -Eye exam: Follows in [place], UTD 3/23 -Foot exam: Due today -Microalbumin: UTD 9/23 -Statin: None -PNA vaccine: 23 in 2022 -Denies symptoms of hypoglycemia, polyuria, polydipsia, numbness extremities, foot ulcers/trauma.   HLD: -Medications: None -Last lipid panel: Lipid Panel     Component Value Date/Time   CHOL 223 (H) 06/23/2022 1348   TRIG 99 06/23/2022 1348   HDL 53 06/23/2022 1348   CHOLHDL 4.2 06/23/2022 1348   LDLCALC 149 (H) 06/23/2022 1348   The 10-year ASCVD risk score (Arnett DK, et al., 2019) is: 4.2%   Values used to calculate the score:     Age: 31 years     Sex: Female     Is  Non-Hispanic African American: Yes     Diabetic: Yes     Tobacco smoker: No     Systolic Blood Pressure: 124 mmHg     Is BP treated: No     HDL Cholesterol: 53 mg/dL     Total Cholesterol: 223 mg/dL  Seasonal Allergies: -Currently taking Astelin daily, has Flonase PRN -Not on any oral anti-histamines - starting Claritin  -Does have associated vertigo with ear pressure, changes in hearing and tinnitus. Did see ENT and now ophthalmology for dry eye  Asthma:  -Asthma status: stable -Current Treatments: Albuterol PRN -Satisfied with current treatment?: yes -Albuterol/rescue inhaler frequency: maybe once a month  -Visits to ER or Urgent Care in past year: no -Pneumovax: Up to Date -Influenza: Up to Date  Health Maintenance: -Blood work UTD -Mammogram 5/23 - Birads-2  -Colon cancer screening due - apt. First of the year   Past Medical History:  Diagnosis Date   Allergy    Back pain    Dermatitis of external ear    Diabetes mellitus without complication (HCC)     Past Surgical History:  Procedure Laterality Date   ABDOMINAL HYSTERECTOMY     BREAST BIOPSY Left    CHOLECYSTECTOMY     TUBAL LIGATION      Family History  Problem Relation Age of Onset   Hyperlipidemia Mother    Diabetes Mother    Glaucoma Mother    Heart disease Mother  Hyperlipidemia Father    Breast cancer Paternal Aunt     Social History   Socioeconomic History   Marital status: Married    Spouse name: Not on file   Number of children: Not on file   Years of education: Not on file   Highest education level: Not on file  Occupational History   Not on file  Tobacco Use   Smoking status: Never   Smokeless tobacco: Never  Vaping Use   Vaping Use: Never used  Substance and Sexual Activity   Alcohol use: Yes    Alcohol/week: 1.0 standard drink of alcohol    Types: 1 Glasses of wine per week    Comment: occasional   Drug use: Never   Sexual activity: Yes  Other Topics Concern   Not on  file  Social History Narrative   Not on file   Social Determinants of Health   Financial Resource Strain: Not on file  Food Insecurity: Not on file  Transportation Needs: Not on file  Physical Activity: Not on file  Stress: Not on file  Social Connections: Not on file  Intimate Partner Violence: Not on file    ROS Review of Systems  Constitutional:  Negative for chills and fever.  Eyes:  Negative for visual disturbance.  Respiratory:  Negative for cough and shortness of breath.   Cardiovascular:  Negative for chest pain.  Gastrointestinal:  Positive for nausea. Negative for abdominal pain.    Objective:   Today's Vitals: BP 124/78   Pulse 94   Temp 97.9 F (36.6 C) (Oral)   Resp 16   Ht 5\' 4"  (1.626 m)   Wt 198 lb 8 oz (90 kg)   SpO2 95%   BMI 34.07 kg/m   Physical Exam Constitutional:      Appearance: Normal appearance.  HENT:     Head: Normocephalic and atraumatic.  Eyes:     Conjunctiva/sclera: Conjunctivae normal.  Cardiovascular:     Rate and Rhythm: Normal rate and regular rhythm.     Pulses:          Dorsalis pedis pulses are 2+ on the right side and 2+ on the left side.  Pulmonary:     Effort: Pulmonary effort is normal.     Breath sounds: Normal breath sounds.  Musculoskeletal:     Right lower leg: No edema.     Left lower leg: No edema.     Right foot: Normal range of motion. No deformity, bunion, Charcot foot, foot drop or prominent metatarsal heads.     Left foot: Normal range of motion. No deformity, bunion, Charcot foot, foot drop or prominent metatarsal heads.  Feet:     Right foot:     Protective Sensation: 6 sites tested.  6 sites sensed.     Skin integrity: Skin integrity normal.     Toenail Condition: Right toenails are normal.     Left foot:     Protective Sensation: 6 sites tested.  6 sites sensed.     Skin integrity: Skin integrity normal.     Toenail Condition: Left toenails are normal.  Skin:    General: Skin is warm and dry.   Neurological:     General: No focal deficit present.     Mental Status: She is alert. Mental status is at baseline.  Psychiatric:        Mood and Affect: Mood normal.        Behavior: Behavior normal.  Assessment & Plan:   1. Type 2 diabetes mellitus with hyperglycemia, without long-term current use of insulin [facility]): Patient is having some issues with her Ozempic, which was increased to 1 mg weekly at her last office visit.  She is having some mild nausea.  She also endorses decreased appetite but no change in weight.  We discussed potentially switching her to a different GLP-1, however patient would like to give the 1 mg of Ozempic another try.  Diabetic foot exam today.  Follow-up in 3 months to recheck A1c and potentially change medications depending on how she is reacting to the Ozempic.  - HM Diabetes Foot Exam - Semaglutide, 1 MG/DOSE, 4 MG/3ML SOPN; Inject 1 mg as directed once a week.  Dispense: 3 mL; Refill: 2  2. Anxiety: Stable.  Continue hydroxyzine as needed.  3. Seasonal allergies: Has been seeing ophthalmology lately for bilateral dry eye.  She is on steroid eyedrops as well as lubricating drops.  Continue over-the-counter antihistamine.  She is also using as Astelin nasal spray for allergies.   Follow-up: Return in about 3 months (around 12/08/2022).   Margarita Mail, DO

## 2022-09-08 ENCOUNTER — Ambulatory Visit (INDEPENDENT_AMBULATORY_CARE_PROVIDER_SITE_OTHER): Payer: Self-pay | Admitting: Internal Medicine

## 2022-09-08 ENCOUNTER — Encounter: Payer: Self-pay | Admitting: Internal Medicine

## 2022-09-08 VITALS — BP 124/78 | HR 94 | Temp 97.9°F | Resp 16 | Ht 64.0 in | Wt 198.5 lb

## 2022-09-08 DIAGNOSIS — E1165 Type 2 diabetes mellitus with hyperglycemia: Secondary | ICD-10-CM

## 2022-09-08 DIAGNOSIS — F419 Anxiety disorder, unspecified: Secondary | ICD-10-CM

## 2022-09-08 DIAGNOSIS — J302 Other seasonal allergic rhinitis: Secondary | ICD-10-CM

## 2022-09-08 MED ORDER — SEMAGLUTIDE (1 MG/DOSE) 4 MG/3ML ~~LOC~~ SOPN: 1.0000 mg | PEN_INJECTOR | SUBCUTANEOUS | 2 refills | Status: DC

## 2022-09-30 ENCOUNTER — Other Ambulatory Visit (HOSPITAL_COMMUNITY): Payer: Self-pay

## 2022-11-06 ENCOUNTER — Telehealth: Payer: Self-pay | Admitting: Urgent Care

## 2022-11-06 DIAGNOSIS — J0100 Acute maxillary sinusitis, unspecified: Secondary | ICD-10-CM

## 2022-11-06 MED ORDER — DOXYCYCLINE HYCLATE 100 MG PO TABS: 100.0000 mg | ORAL_TABLET | Freq: Two times a day (BID) | ORAL | 0 refills | Status: AC

## 2022-11-06 NOTE — Progress Notes (Signed)
E-Visit for Sinus Problems ?  ?We are sorry that you are not feeling well.  Here is how we plan to help! ?  ?Based on what you have shared with me it looks like you have sinusitis.  Sinusitis is inflammation and infection in the sinus cavities of the head.  Based on your presentation I believe you most likely have Acute Bacterial Sinusitis.  This is an infection caused by bacteria and is treated with antibiotics. I have prescribed Doxycycline 100mg by mouth twice a day for 10 days. You may use an oral decongestant such as Mucinex D or if you have glaucoma or high blood pressure use plain Mucinex. Saline nasal spray help and can safely be used as often as needed for congestion.  If you develop worsening sinus pain, fever or notice severe headache and vision changes, or if symptoms are not better after completion of antibiotic, please schedule an appointment with a health care provider.   ?  ?Sinus infections are not as easily transmitted as other respiratory infection, however we still recommend that you avoid close contact with loved ones, especially the very young and elderly.  Remember to wash your hands thoroughly throughout the day as this is the number one way to prevent the spread of infection! ?  ?Home Care: ?Only take medications as instructed by your medical team. ?Complete the entire course of an antibiotic. ?Do not take these medications with alcohol. ?A steam or ultrasonic humidifier can help congestion.  You can place a towel over your head and breathe in the steam from hot water coming from a faucet. ?Avoid close contacts especially the very young and the elderly. ?Cover your mouth when you cough or sneeze. ?Always remember to wash your hands. ?  ?Get Help Right Away If: ?You develop worsening fever or sinus pain. ?You develop a severe head ache or visual changes. ?Your symptoms persist after you have completed your treatment plan. ?  ?Make sure you ?Understand these instructions. ?Will watch your  condition. ?Will get help right away if you are not doing well or get worse. ?  ?Thank you for choosing an e-visit. ?  ?Your e-visit answers were reviewed by a board certified advanced clinical practitioner to complete your personal care plan. Depending upon the condition, your plan could have included both over the counter or prescription medications. ?  ?Please review your pharmacy choice. Make sure the pharmacy is open so you can pick up prescription now. If there is a problem, you may contact your provider through MyChart messaging and have the prescription routed to another pharmacy.  Your safety is important to us. If you have drug allergies check your prescription carefully.  ?  ?For the next 24 hours you can use MyChart to ask questions about today's visit, request a non-urgent call back, or ask for a work or school excuse. ?You will get an email in the next two days asking about your experience. I hope that your e-visit has been valuable and will speed your recovery.  ?  ?I have spent 5 minutes in review of e-visit questionnaire, review and updating patient chart, medical decision making and response to patient.  ? ?Mariona Scholes L Loralei Radcliffe, PA ? ?  ?

## 2022-12-07 ENCOUNTER — Other Ambulatory Visit: Payer: Self-pay

## 2022-12-07 ENCOUNTER — Telehealth: Payer: Self-pay

## 2022-12-07 DIAGNOSIS — Z1211 Encounter for screening for malignant neoplasm of colon: Secondary | ICD-10-CM

## 2022-12-07 MED ORDER — NA SULFATE-K SULFATE-MG SULF 17.5-3.13-1.6 GM/177ML PO SOLN: 1.0000 | Freq: Once | ORAL | 0 refills | Status: AC

## 2022-12-07 NOTE — Progress Notes (Signed)
Established Patient Office Visit  Subjective:  Patient ID: Helen Rodgers, female    DOB: 12/15/1970  Age: 52 y.o. MRN: [id number]  CC:  Chief Complaint  Patient presents with   Follow-up    HPI [name] presents for follow up on chronic medical conditions.   Patient had an episode last month where she has having severe epigastric and LUQ pain. She felt like she had to go to the bathroom and had a large bowel movement that was black in color and the pain resolved. She had another BM later that was normal in color. She denies any further episodes of abdominal pain or change in stool caliber or color. No bright red rectal bleeding. She does have some nausea but she is on Ozempic. No diarrhea, fevers, urinary changes.    Diabetes, Type 2: -First diagnosed in 2022 -Last A1c 5.7% 9/23 -Medications: Ozempic 1 mg  - today stating that she has a decreased appetite but has not lost any weight. Continues to have occasional nausea, no vomiting.  Failed Meds: Metformin or Glipizide (had a lot of abdominal pain and bloating). -Patient is compliant with the above medications and reports no side effects.  -Checking BG at home: post-prandial 120's usually. Lowest 90  -Exercise: walks, works out on elliptical  -Diet: hard boiled eggs, fruit, small portions  -Eye exam: Follows in [place], UTD 3/23 -Foot exam: UTD 12/23 -Microalbumin: UTD 9/23 -Statin: None -PNA vaccine: 23 in 2022 -Denies symptoms of hypoglycemia, polyuria, polydipsia, numbness extremities, foot ulcers/trauma.   HLD: -Medications: None -Last lipid panel: Lipid Panel     Component Value Date/Time   CHOL 223 (H) 06/23/2022 1348   TRIG 99 06/23/2022 1348   HDL 53 06/23/2022 1348   CHOLHDL 4.2 06/23/2022 1348   LDLCALC 149 (H) 06/23/2022 1348   The 10-year ASCVD risk score (Arnett DK, et al., 2019) is: 4.5%   Values used to calculate the score:     Age: 80 years     Sex: Female     Is Non-Hispanic African American: Yes      Diabetic: Yes     Tobacco smoker: No     Systolic Blood Pressure: 989 mmHg     Is BP treated: No     HDL Cholesterol: 53 mg/dL     Total Cholesterol: 223 mg/dL  Seasonal Allergies: -Currently taking Astelin daily, has Flonase PRN -Not on any oral anti-histamines - starting Claritin  -Does have associated vertigo with ear pressure, changes in hearing and tinnitus. Did see ENT and now ophthalmology for dry eye  Asthma:  -Asthma status: stable -Current Treatments: Albuterol PRN -Satisfied with current treatment?: yes -Albuterol/rescue inhaler frequency: maybe once a month  -Visits to ER or Urgent Care in past year: no -Pneumovax: Up to Date -Influenza: Up to Date  Health Maintenance: -Blood work UTD -Mammogram 5/23 - Birads-2  -Colon cancer screening due - apt. First of the year   Past Medical History:  Diagnosis Date   Allergy    Back pain    Dermatitis of external ear    Diabetes mellitus without complication ([place])     Past Surgical History:  Procedure Laterality Date   ABDOMINAL HYSTERECTOMY     BREAST BIOPSY Left    CHOLECYSTECTOMY     TUBAL LIGATION      Family History  Problem Relation Age of Onset   Hyperlipidemia Mother    Diabetes Mother    Glaucoma Mother    Heart disease Mother  Hyperlipidemia Father    Breast cancer Paternal Aunt     Social History   Socioeconomic History   Marital status: Married    Spouse name: Not on file   Number of children: Not on file   Years of education: Not on file   Highest education level: Not on file  Occupational History   Not on file  Tobacco Use   Smoking status: Never   Smokeless tobacco: Never  Vaping Use   Vaping Use: Never used  Substance and Sexual Activity   Alcohol use: Yes    Alcohol/week: 1.0 standard drink of alcohol    Types: 1 Glasses of wine per week    Comment: occasional   Drug use: Never   Sexual activity: Yes  Other Topics Concern   Not on file  Social History Narrative   Not on  file   Social Determinants of Health   Financial Resource Strain: Not on file  Food Insecurity: Not on file  Transportation Needs: Not on file  Physical Activity: Not on file  Stress: Not on file  Social Connections: Not on file  Intimate Partner Violence: Not on file    ROS Review of Systems  Constitutional:  Negative for chills and fever.  Eyes:  Negative for visual disturbance.  Respiratory:  Negative for cough and shortness of breath.   Cardiovascular:  Negative for chest pain.  Gastrointestinal:  Positive for abdominal pain and nausea. Negative for diarrhea and vomiting.    Objective:   Today's Vitals: BP 126/80   Pulse 87   Temp 97.8 F (36.6 C)   Resp 16   Ht 5\' 4"  (1.626 m)   Wt 196 lb (88.9 kg)   SpO2 95%   BMI 33.64 kg/m   Physical Exam Constitutional:      Appearance: Normal appearance.  HENT:     Head: Normocephalic and atraumatic.  Eyes:     Conjunctiva/sclera: Conjunctivae normal.  Cardiovascular:     Rate and Rhythm: Normal rate and regular rhythm.  Pulmonary:     Effort: Pulmonary effort is normal.     Breath sounds: Normal breath sounds.  Abdominal:     General: Bowel sounds are normal. There is no distension.     Palpations: Abdomen is soft. There is no mass.     Tenderness: There is abdominal tenderness. There is no right CVA tenderness, left CVA tenderness, guarding or rebound.     Hernia: No hernia is present.     Comments: Pain to palpation in LUQ  Musculoskeletal:     Right lower leg: No edema.     Left lower leg: No edema.  Skin:    General: Skin is warm and dry.  Neurological:     General: No focal deficit present.     Mental Status: She is alert. Mental status is at baseline.  Psychiatric:        Mood and Affect: Mood normal.        Behavior: Behavior normal.     Assessment & Plan:    1. LUQ pain/Dark stools: One severe episode of pain 1 month ago. Pain present on exam in the epigastric and LUQ area. Will obtain labs  today, lipase to rule out pancreatitis. Had one dark stool, patient given hemoccult card to return. Patient does have a colonoscopy scheduled at the end of the month on the 29th. Follow up here in 1 month.   - CBC w/Diff/Platelet - COMPLETE METABOLIC PANEL WITH GFR - Lipase  2. Type 2 diabetes mellitus with hyperglycemia, without long-term current use of insulin ([place]): A1c 5.9%, decrease Ozempic to 0.5 mg but if she still has symptoms at follow up it will be completely discontinued.   - POCT HgB A1C - Semaglutide,0.25 or 0.5MG /DOS, (OZEMPIC, 0.25 OR 0.5 MG/DOSE,) 2 MG/3ML SOPN; Inject 0.5 mg into the skin once a week.  Dispense: 3 mL; Refill: 0   Follow-up: Return in about 4 weeks (around 01/05/2023).   Teodora Medici, DO

## 2022-12-07 NOTE — Telephone Encounter (Signed)
Gastroenterology Pre-Procedure Review  Request Date: 12/29/22 Requesting Physician: Dr. Marius Ditch  PATIENT REVIEW QUESTIONS: The patient responded to the following health history questions as indicated:    1. Are you having any GI issues? no 2. Do you have a personal history of Polyps? no 3. Do you have a family history of Colon Cancer or Polyps? yes (grandmother colon cancer) 4. Diabetes Mellitus? yes (type 2 takes weekly Ozempic on Thursdays has been advised to stop on 12/21/22) 5. Joint replacements in the past 12 months?no 6. Major health problems in the past 3 months?no 7. Any artificial heart valves, MVP, or defibrillator?no    MEDICATIONS & ALLERGIES:    Patient reports the following regarding taking any anticoagulation/antiplatelet therapy:   Plavix, Coumadin, Eliquis, Xarelto, Lovenox, Pradaxa, Brilinta, or Effient? no Aspirin? no  Patient confirms/reports the following medications:  Current Outpatient Medications  Medication Sig Dispense Refill   albuterol (VENTOLIN HFA) 108 (90 Base) MCG/ACT inhaler Inhale 1-2 puffs into the lungs every 6 (six) hours as needed.     azelastine (ASTELIN) 0.1 % nasal spray 1 spray into each nostril Two (2) times a day. Use in each nostril as directed (Patient not taking: Reported on 09/08/2022)     Chlorphen-PE-Acetaminophen 4-10-325 MG TABS Take by mouth.     Cholecalciferol 1.25 MG (50000 UT) capsule Monthly     clotrimazole-betamethasone (LOTRISONE) cream      cyclobenzaprine (FLEXERIL) 5 MG tablet Take 1 tablet (5 mg total) by mouth 3 (three) times daily as needed for Muscle spasms for up to 10 days 30 tablet 0   hydrOXYzine (ATARAX) 10 MG tablet Take 1 tablet (10 mg total) by mouth 3 (three) times daily as needed. 30 tablet 0   ibuprofen (ADVIL) 600 MG tablet Take 1 tablet by mouth every 6 hours as needed. 30 tablet 0   Semaglutide, 1 MG/DOSE, 4 MG/3ML SOPN Inject 1 mg as directed once a week. 3 mL 2   No current facility-administered  medications for this visit.    Patient confirms/reports the following allergies:  Allergies  Allergen Reactions   Cephalexin Rash   Sulfa Antibiotics Hives and Rash   Hydrocodone Other (See Comments)    Vaginal swelling  Vaginal swelling     Hydrocodone-Acetaminophen Itching and Swelling   Oxycodone Itching   Penicillins Hives, Itching, Other (See Comments) and Rash    Vaginal swelling Vaginal swelling Vaginal swelling Vaginal swelling Vaginal swelling Vaginal swelling Vaginal swelling     No orders of the defined types were placed in this encounter.   AUTHORIZATION INFORMATION Primary Insurance: 1D#: Group #:  Secondary Insurance: 1D#: Group #:  SCHEDULE INFORMATION: Date: 12/21/22 Time: Location: [facility]

## 2022-12-08 ENCOUNTER — Encounter: Payer: Self-pay | Admitting: Internal Medicine

## 2022-12-08 ENCOUNTER — Ambulatory Visit: Payer: Self-pay | Admitting: Internal Medicine

## 2022-12-08 VITALS — BP 126/80 | HR 87 | Temp 97.8°F | Resp 16 | Ht 64.0 in | Wt 196.0 lb

## 2022-12-08 DIAGNOSIS — R1012 Left upper quadrant pain: Secondary | ICD-10-CM

## 2022-12-08 DIAGNOSIS — E1165 Type 2 diabetes mellitus with hyperglycemia: Secondary | ICD-10-CM

## 2022-12-08 DIAGNOSIS — R195 Other fecal abnormalities: Secondary | ICD-10-CM

## 2022-12-08 LAB — HM DIABETES EYE EXAM

## 2022-12-08 LAB — POCT GLYCOSYLATED HEMOGLOBIN (HGB A1C): Hemoglobin A1C: 5.9 % — AB (ref 4.0–5.6)

## 2022-12-08 MED ORDER — OZEMPIC (0.25 OR 0.5 MG/DOSE) 2 MG/3ML ~~LOC~~ SOPN: 0.5000 mg | PEN_INJECTOR | SUBCUTANEOUS | 0 refills | Status: DC

## 2022-12-08 NOTE — Patient Instructions (Addendum)
It was great seeing you today!  Plan discussed at today's visit: -Blood work ordered today, results will be uploaded to [facility].  -Hemoccult card provided  -A1c 5.9%, decrease Ozempic to 0.5 mg weekly  -Plan for colonoscopy at the end of the month   Follow up in: 1 month   Take care and let us know if you have any questions or concerns prior to your next visit.  Dr. Rosana Berger

## 2022-12-09 LAB — COMPLETE METABOLIC PANEL WITH GFR
AG Ratio: 1.1 (calc) (ref 1.0–2.5)
ALT: 19 U/L (ref 6–29)
AST: 18 U/L (ref 10–35)
Albumin: 4 g/dL (ref 3.6–5.1)
Alkaline phosphatase (APISO): 94 U/L (ref 31–125)
BUN: 9 mg/dL (ref 7–25)
CO2: 26 mmol/L (ref 20–32)
Calcium: 8.9 mg/dL (ref 8.6–10.2)
Chloride: 105 mmol/L (ref 98–110)
Creat: 0.73 mg/dL (ref 0.50–0.99)
Globulin: 3.6 g/dL (calc) (ref 1.9–3.7)
Glucose, Bld: 128 mg/dL — ABNORMAL HIGH (ref 65–99)
Potassium: 3.6 mmol/L (ref 3.5–5.3)
Sodium: 140 mmol/L (ref 135–146)
Total Bilirubin: 0.4 mg/dL (ref 0.2–1.2)
Total Protein: 7.6 g/dL (ref 6.1–8.1)
eGFR: 101 mL/min/{1.73_m2} (ref >=60)

## 2022-12-09 LAB — CBC WITH DIFFERENTIAL/PLATELET
Absolute Monocytes: 370 cells/uL (ref 200–950)
Basophils Absolute: 30 cells/uL (ref 0–200)
Basophils Relative: 0.4 %
Eosinophils Absolute: 118 cells/uL (ref 15–500)
Eosinophils Relative: 1.6 %
HCT: 39.9 % (ref 35.0–45.0)
Hemoglobin: 13.2 g/dL (ref 11.7–15.5)
Lymphs Abs: 3478 cells/uL (ref 850–3900)
MCH: 30.5 pg (ref 27.0–33.0)
MCHC: 33.1 g/dL (ref 32.0–36.0)
MCV: 92.1 fL (ref 80.0–100.0)
MPV: 9.3 fL (ref 7.5–12.5)
Monocytes Relative: 5 %
Neutro Abs: 3404 cells/uL (ref 1500–7800)
Neutrophils Relative %: 46 %
Platelets: 328 10*3/uL (ref 140–400)
RBC: 4.33 10*6/uL (ref 3.80–5.10)
RDW: 12 % (ref 11.0–15.0)
Total Lymphocyte: 47 %
WBC: 7.4 10*3/uL (ref 3.8–10.8)

## 2022-12-09 LAB — LIPASE: Lipase: 43 U/L (ref 7–60)

## 2022-12-14 ENCOUNTER — Encounter: Payer: Self-pay | Admitting: Internal Medicine

## 2022-12-25 ENCOUNTER — Ambulatory Visit: Payer: Self-pay | Admitting: *Deleted

## 2022-12-25 NOTE — Telephone Encounter (Signed)
  Chief Complaint: Congestion Symptoms: Congestion "In chest, can't really get anything up." Reports wheezing in AM and using inhaler more frequently today. Frequency: Yesterday Pertinent Negatives: Patient denies fever Disposition: [] ED /[] Urgent Care (no appt availability in office) / [x] Appointment(In office/virtual)/ []  [facility]/ [] Home Care/ [] Refused Recommended Disposition /[] [facility]/ []  Follow-up with PCP Additional Notes: Secured appt for tomorrow. Care advise provided, pt verbalizes understanding. Reason for Disposition  [1] Known COPD or other severe lung disease (i.e., bronchiectasis, cystic fibrosis, lung surgery) AND [2] worsening symptoms (i.e., increased sputum purulence or amount, increased breathing difficulty  Answer Assessment - Initial Assessment Questions 1. ONSET: "When did the cough begin?"      Yesterday 2. SEVERITY: "How bad is the cough today?"      Off and on 3. SPUTUM: "Describe the color of your sputum" (none, dry cough; clear, white, yellow, green)     Can't get it up." 4. HEMOPTYSIS: "Are you coughing up any blood?" If so ask: "How much?" (flecks, streaks, tablespoons, etc.)      5. DIFFICULTY BREATHING: "Are you having difficulty breathing?" If Yes, ask: "How bad is it?" (e.g., mild, moderate, severe)    - MILD: No SOB at rest, mild SOB with walking, speaks normally in sentences, can lie down, no retractions, pulse < 100.    - MODERATE: SOB at rest, SOB with minimal exertion and prefers to sit, cannot lie down flat, speaks in phrases, mild retractions, audible wheezing, pulse 100-120.    - SEVERE: Very SOB at rest, speaks in single words, struggling to breathe, sitting hunched forward, retractions, pulse > 120      no 6. FEVER: "Do you have a fever?" If Yes, ask: "What is your temperature, how was it measured, and when did it start?"     No 7. CARDIAC HISTORY: "Do you have any history of heart disease?" (e.g., heart attack,  congestive heart failure)       8. LUNG HISTORY: "Do you have any history of lung disease?"  (e.g., pulmonary embolus, asthma, emphysema)      9. PE RISK FACTORS: "Do you have a history of blood clots?" (or: recent major surgery, recent prolonged travel, bedridden)      10. OTHER SYMPTOMS: "Do you have any other symptoms?" (e.g., runny nose, wheezing, chest pain)       Congestion, used inhaler, "It helped a little bit"  Protocols used: Cough - Acute Productive-A-AH

## 2022-12-25 NOTE — Telephone Encounter (Signed)
Summary: Pt has questions/concrns about med she can take since she is having colonoscopy this week   Pt stated she is scheduled to have a colonoscopy this week and she would like to know if there is something that she can take for allergies and congestion. Pt requests call back. Cb# [phone]      Called patient [phone] to review medication questions prior to procedure. No answer, LVMTCB [phone].

## 2022-12-26 ENCOUNTER — Encounter: Payer: Self-pay | Admitting: Family Medicine

## 2022-12-26 ENCOUNTER — Ambulatory Visit (INDEPENDENT_AMBULATORY_CARE_PROVIDER_SITE_OTHER): Payer: BLUE CROSS/BLUE SHIELD | Admitting: Family Medicine

## 2022-12-26 ENCOUNTER — Telehealth: Payer: Self-pay

## 2022-12-26 VITALS — BP 128/74 | HR 98 | Temp 98.5°F | Resp 16 | Ht 64.0 in | Wt 193.7 lb

## 2022-12-26 DIAGNOSIS — R0602 Shortness of breath: Secondary | ICD-10-CM

## 2022-12-26 DIAGNOSIS — J069 Acute upper respiratory infection, unspecified: Secondary | ICD-10-CM

## 2022-12-26 MED ORDER — PREDNISONE 20 MG PO TABS: 40.0000 mg | ORAL_TABLET | Freq: Every day | ORAL | 0 refills | Status: AC

## 2022-12-26 MED ORDER — BENZONATATE 100 MG PO CAPS: 100.0000 mg | ORAL_CAPSULE | Freq: Three times a day (TID) | ORAL | 0 refills | Status: DC | PRN

## 2022-12-26 MED ORDER — ALBUTEROL SULFATE (2.5 MG/3ML) 0.083% IN NEBU
2.5000 mg | INHALATION_SOLUTION | Freq: Once | RESPIRATORY_TRACT | Status: AC
  Administered 2022-12-26: 2.5 mg via RESPIRATORY_TRACT

## 2022-12-26 MED ORDER — GUAIFENESIN ER 600 MG PO TB12: 600.0000 mg | ORAL_TABLET | Freq: Two times a day (BID) | ORAL | 0 refills | Status: DC

## 2022-12-26 NOTE — Progress Notes (Signed)
Patient ID: Helen Rodgers, female    DOB: Oct 04, 1970, 52 y.o.   MRN: [id number]  PCP: Teodora Medici, DO  Chief Complaint  Patient presents with   Cough   Nasal Congestion   Wheezing    At night the past 2 days, sx started on Sunday    Subjective:   Helen Rodgers is a 52 y.o. female, presents to clinic with CC of the following:  HPI  Pt presents with 2 d of sx, wheezing at night, cough, congestion, nasal drainage No CP with breathing but feels SOB/tight No fever, chills, sweats, DOE Hx of allergies and asthma - likely a asthma exacerbation  Patient Active Problem List   Diagnosis Date Noted   Mixed hyperlipidemia 12/22/2021   Seasonal allergies 12/22/2021   Type 2 diabetes mellitus with hyperglycemia, without long-term current use of insulin ([place]) 12/22/2021   Type 2 diabetes mellitus with hyperglycemia, without long-term current use of insulin ([place]) 12/09/2020   Mild intermittent asthma without complication [id number]      Current Outpatient Medications:    albuterol (VENTOLIN HFA) 108 (90 Base) MCG/ACT inhaler, Inhale 1-2 puffs into the lungs every 6 (six) hours as needed., Disp: , Rfl:    Chlorphen-PE-Acetaminophen 4-10-325 MG TABS, Take by mouth., Disp: , Rfl:    Cholecalciferol 1.25 MG (50000 UT) capsule, Monthly, Disp: , Rfl:    clotrimazole-betamethasone (LOTRISONE) cream, , Disp: , Rfl:    cyclobenzaprine (FLEXERIL) 5 MG tablet, Take 1 tablet (5 mg total) by mouth 3 (three) times daily as needed for Muscle spasms for up to 10 days, Disp: 30 tablet, Rfl: 0   hydrOXYzine (ATARAX) 10 MG tablet, Take 1 tablet (10 mg total) by mouth 3 (three) times daily as needed., Disp: 30 tablet, Rfl: 0   ibuprofen (ADVIL) 600 MG tablet, Take 1 tablet by mouth every 6 hours as needed., Disp: 30 tablet, Rfl: 0   Semaglutide,0.25 or 0.5MG /DOS, (OZEMPIC, 0.25 OR 0.5 MG/DOSE,) 2 MG/3ML SOPN, Inject 0.5 mg into the skin once a week., Disp: 3 mL, Rfl: 0   Na Sulfate-K Sulfate-Mg Sulf  17.5-3.13-1.6 GM/177ML SOLN, Take by mouth. (Patient not taking: Reported on 12/26/2022), Disp: , Rfl:    Allergies  Allergen Reactions   Cephalexin Rash   Sulfa Antibiotics Hives and Rash   Hydrocodone Other (See Comments)    Vaginal swelling  Vaginal swelling     Hydrocodone-Acetaminophen Itching and Swelling   Oxycodone Itching   Penicillins Hives, Itching, Other (See Comments) and Rash    Vaginal swelling Vaginal swelling Vaginal swelling Vaginal swelling Vaginal swelling Vaginal swelling Vaginal swelling      Social History   Tobacco Use   Smoking status: Never   Smokeless tobacco: Never  Vaping Use   Vaping Use: Never used  Substance Use Topics   Alcohol use: Yes    Alcohol/week: 1.0 standard drink of alcohol    Types: 1 Glasses of wine per week    Comment: occasional   Drug use: Never      Chart Review Today: I personally reviewed active problem list, medication list, allergies, family history, social history, health maintenance, notes from last encounter, lab results, imaging with the patient/caregiver today.   Review of Systems  Constitutional: Negative.   HENT: Negative.    Eyes: Negative.   Respiratory: Negative.    Cardiovascular: Negative.   Gastrointestinal: Negative.   Endocrine: Negative.   Genitourinary: Negative.   Musculoskeletal: Negative.   Skin: Negative.   Allergic/Immunologic: Negative.  Neurological: Negative.   Hematological: Negative.   Psychiatric/Behavioral: Negative.    All other systems reviewed and are negative.      Objective:   Vitals:   12/26/22 0821  BP: 128/74  Pulse: 98  Resp: 16  Temp: 98.5 F (36.9 C)  TempSrc: Oral  SpO2: 97%  Weight: 193 lb 11.2 oz (87.9 kg)  Height: 5\' 4"  (1.626 m)    Body mass index is 33.25 kg/m.  Physical Exam Vitals and nursing note reviewed.  Constitutional:      General: She is not in acute distress.    Appearance: Normal appearance. She is well-developed. She is not  ill-appearing, toxic-appearing or diaphoretic.  HENT:     Head: Normocephalic and atraumatic.     Right Ear: Hearing, tympanic membrane, ear canal and external ear normal.     Left Ear: Hearing, tympanic membrane, ear canal and external ear normal.     Nose: Mucosal edema and rhinorrhea present.     Right Sinus: No maxillary sinus tenderness or frontal sinus tenderness.     Left Sinus: No maxillary sinus tenderness or frontal sinus tenderness.     Mouth/Throat:     Mouth: Mucous membranes are not pale.     Pharynx: Uvula midline. Posterior oropharyngeal erythema (mild) present. No oropharyngeal exudate or uvula swelling.     Tonsils: No tonsillar abscesses.  Eyes:     General: No scleral icterus.       Right eye: No discharge.        Left eye: No discharge.     Conjunctiva/sclera: Conjunctivae normal.  Neck:     Trachea: No tracheal deviation.  Cardiovascular:     Rate and Rhythm: Normal rate and regular rhythm.     Pulses: Normal pulses.     Heart sounds: Normal heart sounds. No murmur heard.    No friction rub. No gallop.  Pulmonary:     Effort: Pulmonary effort is normal. No respiratory distress.     Breath sounds: No stridor. Rhonchi present. No wheezing or rales.  Abdominal:     General: Bowel sounds are normal. There is no distension.     Palpations: Abdomen is soft.  Musculoskeletal:        General: Normal range of motion.     Cervical back: Normal range of motion and neck supple.  Skin:    General: Skin is warm and dry.     Coloration: Skin is not pale.     Findings: No rash.  Neurological:     Mental Status: She is alert.     Motor: No abnormal muscle tone.     Coordination: Coordination normal.  Psychiatric:        Behavior: Behavior normal.      Results for orders placed or performed in visit on 12/11/22  HM DIABETES EYE EXAM  Result Value Ref Range   HM Diabetic Eye Exam No Retinopathy No Retinopathy       Assessment & Plan:     ICD-10-CM   1. Upper  respiratory tract infection, unspecified type  J06.9 predniSONE (DELTASONE) 20 MG tablet    benzonatate (TESSALON) 100 MG capsule    guaiFENesin (MUCINEX) 600 MG 12 hr tablet    DG Chest 2 View    albuterol (PROVENTIL) (2.5 MG/3ML) 0.083% nebulizer solution 2.5 mg   likely viral infection/may also be precipitated by allergies, tx with antihistamines, mucininex, inhalers, otc cough meds, likely to be self limiting    2. Shortness of  breath  R06.02 DG Chest 2 View    albuterol (PROVENTIL) (2.5 MG/3ML) 0.083% nebulizer solution 2.5 mg   sx noted improved after neb - likely reactive airway/early bronchitis - start steroids and use inhaler and cough meds          Delsa Grana, PA-C 12/26/22 8:49 AM

## 2022-12-26 NOTE — Telephone Encounter (Signed)
Pt has procedure on Friday and was calling had a question In ref to her medication

## 2022-12-26 NOTE — Telephone Encounter (Signed)
Trish transfer the patient to me and Patient left a voicemail stating she has some questions about her colonoscopy about what medications she can take before her procedure.

## 2022-12-26 NOTE — Telephone Encounter (Signed)
Patient cal has been returned.  Patient was seen by physician at [facility] this morning for allergies.  She said physician prescribed cough medicine (Guafenesin and Benzonate) and Prenisone.  She wanted to know if it was okay for her to take these meds prior to her colonoscopy.  I advised that she can take these meds just as long as the morning of her procedure she takes them after her procedure not before.  Pt verbalized understanding, and said she has stopped her Ozempic as advised also.  Thanks, [place], [place]

## 2022-12-26 NOTE — Telephone Encounter (Signed)
Agree  RV

## 2022-12-28 ENCOUNTER — Encounter: Payer: Self-pay | Admitting: Gastroenterology

## 2022-12-29 ENCOUNTER — Ambulatory Visit: Payer: BLUE CROSS/BLUE SHIELD | Admitting: Certified Registered"

## 2022-12-29 ENCOUNTER — Encounter: Payer: Self-pay | Admitting: Gastroenterology

## 2022-12-29 ENCOUNTER — Ambulatory Visit
Admission: RE | Admit: 2022-12-29 | Discharge: 2022-12-29 | Disposition: A | Discharge status: Home / Self Care | Payer: BLUE CROSS/BLUE SHIELD | Attending: Gastroenterology | Admitting: Gastroenterology

## 2022-12-29 ENCOUNTER — Encounter
Admission: RE | Disposition: A | Discharge status: Home / Self Care | Payer: Self-pay | Source: Home / Self Care | Attending: Gastroenterology

## 2022-12-29 DIAGNOSIS — Z7985 Long-term (current) use of injectable non-insulin antidiabetic drugs: Secondary | ICD-10-CM | POA: Diagnosis not present

## 2022-12-29 DIAGNOSIS — Z9049 Acquired absence of other specified parts of digestive tract: Secondary | ICD-10-CM | POA: Insufficient documentation

## 2022-12-29 DIAGNOSIS — E119 Type 2 diabetes mellitus without complications: Secondary | ICD-10-CM | POA: Insufficient documentation

## 2022-12-29 DIAGNOSIS — D123 Benign neoplasm of transverse colon: Secondary | ICD-10-CM | POA: Diagnosis not present

## 2022-12-29 DIAGNOSIS — Z1211 Encounter for screening for malignant neoplasm of colon: Secondary | ICD-10-CM

## 2022-12-29 DIAGNOSIS — Z90710 Acquired absence of both cervix and uterus: Secondary | ICD-10-CM | POA: Insufficient documentation

## 2022-12-29 DIAGNOSIS — K635 Polyp of colon: Secondary | ICD-10-CM

## 2022-12-29 HISTORY — PX: COLONOSCOPY WITH PROPOFOL: SHX5780

## 2022-12-29 HISTORY — DX: Unspecified asthma, uncomplicated: J45.909

## 2022-12-29 LAB — GLUCOSE, CAPILLARY: Glucose-Capillary: 105 mg/dL — ABNORMAL HIGH (ref 70–99)

## 2022-12-29 SURGERY — COLONOSCOPY WITH PROPOFOL
Anesthesia: General

## 2022-12-29 MED ORDER — PROPOFOL 10 MG/ML IV BOLUS
INTRAVENOUS | Status: DC | PRN
  Administered 2022-12-29: 100 mg via INTRAVENOUS

## 2022-12-29 MED ORDER — LIDOCAINE 2% (20 MG/ML) 5 ML SYRINGE
INTRAMUSCULAR | Status: DC | PRN
  Administered 2022-12-29: 20 mg via INTRAVENOUS

## 2022-12-29 MED ORDER — SODIUM CHLORIDE 0.9 % IV SOLN: INTRAVENOUS | Status: DC

## 2022-12-29 MED ORDER — PROPOFOL 500 MG/50ML IV EMUL
INTRAVENOUS | Status: DC | PRN
  Administered 2022-12-29: 120 ug/kg/min via INTRAVENOUS

## 2022-12-29 MED ORDER — MIDAZOLAM HCL 2 MG/2ML IJ SOLN
INTRAMUSCULAR | Status: AC
  Filled 2022-12-29: qty 2

## 2022-12-29 MED ORDER — MIDAZOLAM HCL 5 MG/5ML IJ SOLN
INTRAMUSCULAR | Status: DC | PRN
  Administered 2022-12-29: 2 mg via INTRAVENOUS

## 2022-12-29 NOTE — H&P (Signed)
Helen Darby, MD [address]  [place]  [place], [place] [address]  Main: [phone]  Fax: [phone] Pager: [phone]  Primary Care Physician:  Teodora Medici, DO Primary Gastroenterologist:  Dr. Cephas Rodgers  Pre-Procedure History & Physical: HPI:  Helen Rodgers is a 52 y.o. female is here for an colonoscopy.   Past Medical History:  Diagnosis Date   Allergy    Asthma    Back pain    Dermatitis of external ear    Diabetes mellitus without complication ([place])     Past Surgical History:  Procedure Laterality Date   ABDOMINAL HYSTERECTOMY     BREAST BIOPSY Left    CHOLECYSTECTOMY     TUBAL LIGATION      Prior to Admission medications   Medication Sig Start Date End Date Taking? Authorizing Provider  predniSONE (DELTASONE) 20 MG tablet Take 2 tablets (40 mg total) by mouth daily with breakfast for 5 days. 12/26/22 12/31/22 Yes Delsa Grana, PA-C  albuterol (VENTOLIN HFA) 108 (90 Base) MCG/ACT inhaler Inhale 1-2 puffs into the lungs every 6 (six) hours as needed. 01/28/20   [provider]  benzonatate (TESSALON) 100 MG capsule Take 1 capsule (100 mg total) by mouth 3 (three) times daily as needed for cough. 12/26/22   Delsa Grana, PA-C  Chlorphen-PE-Acetaminophen 4-10-325 MG TABS Take by mouth. 05/31/17   [provider]  Cholecalciferol 1.25 MG (50000 UT) capsule Monthly 07/11/17   [provider]  clotrimazole-betamethasone (LOTRISONE) cream  11/01/20   [provider]  cyclobenzaprine (FLEXERIL) 5 MG tablet Take 1 tablet (5 mg total) by mouth 3 (three) times daily as needed for Muscle spasms for up to 10 days 11/23/21     guaiFENesin (MUCINEX) 600 MG 12 hr tablet Take 1 tablet (600 mg total) by mouth 2 (two) times daily. 12/26/22   Delsa Grana, PA-C  hydrOXYzine (ATARAX) 10 MG tablet Take 1 tablet (10 mg total) by mouth 3 (three) times daily as needed. 06/23/22   Teodora Medici, DO  ibuprofen (ADVIL) 600 MG tablet Take 1  tablet by mouth every 6 hours as needed. 05/01/22   Teodora Medici, DO  Na Sulfate-K Sulfate-Mg Sulf 17.5-3.13-1.6 GM/177ML SOLN Take by mouth. Patient not taking: Reported on 12/26/2022 12/07/22   [provider]  Semaglutide,0.25 or 0.5MG /DOS, (OZEMPIC, 0.25 OR 0.5 MG/DOSE,) 2 MG/3ML SOPN Inject 0.5 mg into the skin once a week. 12/08/22   Teodora Medici, DO    Allergies as of 12/07/2022 - Review Complete 11/06/2022  Allergen Reaction Noted   Cephalexin Rash 12/12/2017   Sulfa antibiotics Hives and Rash 01/11/2015   Hydrocodone Other (See Comments) 12/24/2020   Hydrocodone-acetaminophen Itching and Swelling 12/26/2021   Oxycodone Itching 01/06/2021   Penicillins Hives, Itching, Other (See Comments), and Rash 01/11/2015    Family History  Problem Relation Age of Onset   Hyperlipidemia Mother    Diabetes Mother    Glaucoma Mother    Heart disease Mother    Hyperlipidemia Father    Breast cancer Paternal Aunt     Social History   Socioeconomic History   Marital status: Married    Spouse name: Not on file   Number of children: Not on file   Years of education: Not on file   Highest education level: Not on file  Occupational History   Not on file  Tobacco Use   Smoking status: Never   Smokeless tobacco: Never  Vaping Use   Vaping Use: Never used  Substance and Sexual Activity   Alcohol use: Yes    Alcohol/week: 1.0 standard drink of alcohol    Types: 1 Glasses of wine per week    Comment: occasional   Drug use: Never   Sexual activity: Yes  Other Topics Concern   Not on file  Social History Narrative   Not on file   Social Determinants of Health   Financial Resource Strain: Not on file  Food Insecurity: Not on file  Transportation Needs: Not on file  Physical Activity: Not on file  Stress: Not on file  Social Connections: Not on file  Intimate Partner Violence: Not on file    Review of Systems: See HPI, otherwise negative ROS  Physical  Exam: BP 125/74   Pulse 81   Temp (!) 95.9 F (35.5 C) (Temporal)   Resp 17   Ht 5\' 4"  (1.626 m)   Wt 86.2 kg   SpO2 100%   BMI 32.61 kg/m  General:   Alert,  pleasant and cooperative in NAD Head:  Normocephalic and atraumatic. Neck:  Supple; no masses or thyromegaly. Lungs:  Clear throughout to auscultation.    Heart:  Regular rate and rhythm. Abdomen:  Soft, nontender and nondistended. Normal bowel sounds, without guarding, and without rebound.   Neurologic:  Alert and  oriented x4;  grossly normal neurologically.  Impression/Plan: Helen Rodgers is here for an colonoscopy to be performed for colon cancer screening  Risks, benefits, limitations, and alternatives regarding  colonoscopy have been reviewed with the patient.  Questions have been answered.  All parties agreeable.   Sherri Sear, MD  12/29/2022, 9:37 AM

## 2022-12-29 NOTE — Transfer of Care (Signed)
Immediate Anesthesia Transfer of Care Note  Patient: Helen Rodgers  Procedure(s) Performed: COLONOSCOPY WITH PROPOFOL  Patient Location: Endoscopy Unit  Anesthesia Type:General  Level of Consciousness: awake and alert   Airway & Oxygen Therapy: Patient Spontanous Breathing  Post-op Assessment: Report given to RN and Post -op Vital signs reviewed and stable  Post vital signs: Reviewed  Last Vitals:  Vitals Value Taken Time  BP 115/73   Temp    Pulse 87   Resp 16   SpO2 100     Last Pain:  Vitals:   12/29/22 0908  TempSrc: Temporal  PainSc: 0-No pain         Complications: No notable events documented.

## 2022-12-29 NOTE — Anesthesia Postprocedure Evaluation (Signed)
Anesthesia Post Note  Patient: Helen Rodgers  Procedure(s) Performed: COLONOSCOPY WITH PROPOFOL  Patient location during evaluation: Endoscopy Anesthesia Type: General Level of consciousness: awake and alert Pain management: pain level controlled Vital Signs Assessment: post-procedure vital signs reviewed and stable Respiratory status: spontaneous breathing, nonlabored ventilation, respiratory function stable and patient connected to nasal cannula oxygen Cardiovascular status: blood pressure returned to baseline and stable Postop Assessment: no apparent nausea or vomiting Anesthetic complications: no   No notable events documented.   Last Vitals:  Vitals:   12/29/22 1021 12/29/22 1031  BP: 110/75 110/78  Pulse: 77 80  Resp: 16   Temp:    SpO2: 100%     Last Pain:  Vitals:   12/29/22 1031  TempSrc:   PainSc: 0-No pain                 Ilene Qua

## 2022-12-29 NOTE — Op Note (Signed)
[facility] Gastroenterology Patient Name: Helen Rodgers Procedure Date: 12/29/2022 9:30 AM MRN: [id number] Account #: [id number] Date of Birth: 01/06/1971 Admit Type: Outpatient Age: 52 Room: [facility] ENDO ROOM 2 Gender: Female Note Status: Finalized Instrument Name: Park Meo [id number] Procedure:             Colonoscopy Indications:           Screening for colorectal malignant neoplasm, This is                         the patient's first colonoscopy Providers:             Lin Landsman MD, MD Referring MD:          No Local Md, MD (Referring MD) Medicines:             General Anesthesia Complications:         No immediate complications. Estimated blood loss: None. Procedure:             Pre-Anesthesia Assessment:                        - Prior to the procedure, a History and Physical was                         performed, and patient medications and allergies were                         reviewed. The patient is competent. The risks and                         benefits of the procedure and the sedation options and                         risks were discussed with the patient. All questions                         were answered and informed consent was obtained.                         Patient identification and proposed procedure were                         verified by the physician, the nurse, the                         anesthesiologist, the anesthetist and the technician                         in the pre-procedure area in the procedure room in the                         endoscopy suite. Mental Status Examination: alert and                         oriented. Airway Examination: normal oropharyngeal                         airway and neck mobility. Respiratory Examination:  clear to auscultation. CV Examination: normal.                         Prophylactic Antibiotics: The patient does not require                         prophylactic  antibiotics. Prior Anticoagulants: The                         patient has taken no anticoagulant or antiplatelet                         agents. ASA Grade Assessment: II - A patient with mild                         systemic disease. After reviewing the risks and                         benefits, the patient was deemed in satisfactory                         condition to undergo the procedure. The anesthesia                         plan was to use general anesthesia. Immediately prior                         to administration of medications, the patient was                         re-assessed for adequacy to receive sedatives. The                         heart rate, respiratory rate, oxygen saturations,                         blood pressure, adequacy of pulmonary ventilation, and                         response to care were monitored throughout the                         procedure. The physical status of the patient was                         re-assessed after the procedure.                        After obtaining informed consent, the colonoscope was                         passed under direct vision. Throughout the procedure,                         the patient's blood pressure, pulse, and oxygen                         saturations were monitored continuously. The  Colonoscope was introduced through the anus and                         advanced to the the cecum, identified by appendiceal                         orifice and ileocecal valve. The colonoscopy was                         performed without difficulty. The patient tolerated                         the procedure well. The quality of the bowel                         preparation was evaluated using the BBPS [facility] Bowel                         Preparation Scale) with scores of: Right Colon = 3,                         Transverse Colon = 3 and Left Colon = 3 (entire mucosa                         seen  well with no residual staining, small fragments                         of stool or opaque liquid). The total BBPS score                         equals 9. The ileocecal valve, appendiceal orifice,                         and rectum were photographed. Findings:      The perianal and digital rectal examinations were normal. Pertinent       negatives include normal sphincter tone and no palpable rectal lesions.      Two sessile polyps were found in the rectum and transverse colon. The       polyps were 4 to 5 mm in size. These polyps were removed with a cold       snare. Resection and retrieval were complete. Estimated blood loss: none.      Retroflexion could not be performed Impression:            - Two 4 to 5 mm polyps in the rectum and in the                         transverse colon, removed with a cold snare. Resected                         and retrieved. Recommendation:        - Discharge patient to home (with escort).                        - Resume previous diet today.                        -  Continue present medications.                        - Await pathology results.                        - Repeat colonoscopy in 5-10 years for surveillance                         based on pathology results. Procedure Code(s):     --- Professional ---                        [phone], Colonoscopy, flexible; with removal of                         tumor(s), polyp(s), or other lesion(s) by snare                         technique Diagnosis Code(s):     --- Professional ---                        Z12.11, Encounter for screening for malignant neoplasm                         of colon                        D12.8, Benign neoplasm of rectum                        D12.3, Benign neoplasm of transverse colon (hepatic                         flexure or splenic flexure) CPT copyright 2022 American Medical Association. All rights reserved. The codes documented in this report are preliminary and upon coder  review may  be revised to meet current compliance requirements. Dr. Ulyess Mort Lin Landsman MD, MD 12/29/2022 10:09:38 AM This report has been signed electronically. Number of Addenda: 0 Note Initiated On: 12/29/2022 9:30 AM Scope Withdrawal Time: 0 hours 10 minutes 43 seconds  Total Procedure Duration: 0 hours 14 minutes 0 seconds  Estimated Blood Loss:  Estimated blood loss: none.      [facility]

## 2022-12-29 NOTE — Anesthesia Preprocedure Evaluation (Signed)
Anesthesia Evaluation  Patient identified by MRN, date of birth, ID band Patient awake    Reviewed: Allergy & Precautions, NPO status , Patient's Chart, lab work & pertinent test results  History of Anesthesia Complications Negative for: history of anesthetic complications  Airway Mallampati: III  TM Distance: >3 FB Neck ROM: full    Dental no notable dental hx.    Pulmonary asthma    Pulmonary exam normal        Cardiovascular negative cardio ROS Normal cardiovascular exam     Neuro/Psych negative neurological ROS  negative psych ROS   GI/Hepatic negative GI ROS, Neg liver ROS,,,  Endo/Other  negative endocrine ROSdiabetes    Renal/GU negative Renal ROS  negative genitourinary   Musculoskeletal   Abdominal   Peds  Hematology negative hematology ROS (+)   Anesthesia Other Findings Past Medical History: No date: Allergy No date: Back pain No date: Dermatitis of external ear No date: Diabetes mellitus without complication ([facility])  Past Surgical History: No date: ABDOMINAL HYSTERECTOMY No date: BREAST BIOPSY; Left No date: CHOLECYSTECTOMY No date: TUBAL LIGATION     Reproductive/Obstetrics negative OB ROS                             Anesthesia Physical Anesthesia Plan  ASA: 2  Anesthesia Plan: General   Post-op Pain Management:    Induction: Intravenous  PONV Risk Score and Plan: Propofol infusion and TIVA  Airway Management Planned: Natural Airway and Nasal Cannula  Additional Equipment:   Intra-op Plan:   Post-operative Plan:   Informed Consent: I have reviewed the patients History and Physical, chart, labs and discussed the procedure including the risks, benefits and alternatives for the proposed anesthesia with the patient or authorized representative who has indicated his/her understanding and acceptance.     Dental Advisory Given  Plan Discussed with:  Anesthesiologist, CRNA and Surgeon  Anesthesia Plan Comments: (Patient consented for risks of anesthesia including but not limited to:  - adverse reactions to medications - risk of airway placement if required - damage to eyes, teeth, lips or other oral mucosa - nerve damage due to positioning  - sore throat or hoarseness - Damage to heart, brain, nerves, lungs, other parts of body or loss of life  Patient voiced understanding.)       Anesthesia Quick Evaluation

## 2023-01-01 ENCOUNTER — Encounter: Payer: Self-pay | Admitting: Gastroenterology

## 2023-01-01 LAB — SURGICAL PATHOLOGY

## 2023-01-04 NOTE — Progress Notes (Signed)
Established Patient Office Visit  Subjective:  Patient ID: Helen Rodgers, female    DOB: 1971-02-21  Age: 52 y.o. MRN: [id number]  CC:  Chief Complaint  Patient presents with   Follow-up    Ozempic decreased dose only done for about 2 weeks no sx yet.  LUQ pain only tender while hugging    HPI Helen Rodgers presents for follow up on chronic medical conditions.     Diabetes, Type 2: -First diagnosed in 2022 -Last A1c 5.9 3/24 -Medications: Ozempic decreased to 0.5 mg at LOV due to abdominal symptoms  Failed Meds: Metformin or Glipizide (had a lot of abdominal pain and bloating). -Patient is compliant with the above medications and reports no side effects.  -Checking BG at home: post-prandial 120's usually. Lowest 90  -Exercise: walks, works out on elliptical  -Diet: hard boiled eggs, fruit, small portions  -Eye exam: Follows in [place], UTD 3/23 -Foot exam: UTD 12/23 -Microalbumin: UTD 9/23 -Statin: None -PNA vaccine: 23 in 2022 -Denies symptoms of hypoglycemia, polyuria, polydipsia, numbness extremities, foot ulcers/trauma.   At last office visit we discussed episodic epigastric/LUQ pain. Patient states that since she has decreased the Ozempic it is better in severity but still present. Worse to the touch or if she is hugged or squeezed. Still a dull ache with occasional sharp pains. She denies changes in stool caliber, nausea/vomiting or fevers. Does have decreased appetite but that has been the case since being on the Ozempic. Denies changes in pain with eating. Did have an episode of dark stools once a few months ago, this has not reoccurred. She had a colonoscopy 12/29/22 and 2 polyps were removed but nothing else seen.   Is also having seasonal allergy symptoms, was seen here for upper respiratory infection last month, was treated with prednisone and mucinex. Still having congestion and throat clearing/post nasal drip.  Past Medical History:  Diagnosis Date   Allergy     Asthma    Back pain    Dermatitis of external ear    Diabetes mellitus without complication     Past Surgical History:  Procedure Laterality Date   ABDOMINAL HYSTERECTOMY     BREAST BIOPSY Left    CHOLECYSTECTOMY     COLONOSCOPY WITH PROPOFOL N/A 12/29/2022   Procedure: COLONOSCOPY WITH PROPOFOL;  Surgeon: Toney Reil, MD;  Location: [facility] ENDOSCOPY;  Service: Gastroenterology;  Laterality: N/A;   TUBAL LIGATION      Family History  Problem Relation Age of Onset   Hyperlipidemia Mother    Diabetes Mother    Glaucoma Mother    Heart disease Mother    Hyperlipidemia Father    Breast cancer Paternal Aunt     Social History   Socioeconomic History   Marital status: Married    Spouse name: Not on file   Number of children: Not on file   Years of education: Not on file   Highest education level: Some college, no degree  Occupational History   Not on file  Tobacco Use   Smoking status: Never   Smokeless tobacco: Never  Vaping Use   Vaping Use: Never used  Substance and Sexual Activity   Alcohol use: Yes    Alcohol/week: 1.0 standard drink of alcohol    Types: 1 Glasses of wine per week    Comment: occasional   Drug use: Never   Sexual activity: Yes  Other Topics Concern   Not on file  Social History Narrative   Not on file  Social Determinants of Health   Financial Resource Strain: Low Risk  (01/04/2023)   Overall Financial Resource Strain (CARDIA)    Difficulty of Paying Living Expenses: Not very hard  Food Insecurity: No Food Insecurity (01/04/2023)   Hunger Vital Sign    Worried About Running Out of Food in the Last Year: Never true    Ran Out of Food in the Last Year: Never true  Transportation Needs: No Transportation Needs (01/04/2023)   PRAPARE - Administrator, Civil Service (Medical): No    Lack of Transportation (Non-Medical): No  Physical Activity: Insufficiently Active (01/04/2023)   Exercise Vital Sign    Days of Exercise per Week: 1  day    Minutes of Exercise per Session: 30 min  Stress: No Stress Concern Present (01/04/2023)   [employer] of Occupational Health - Occupational Stress Questionnaire    Feeling of Stress : Only a little  Social Connections: Socially Integrated (01/04/2023)   Social Connection and Isolation Panel [NHANES]    Frequency of Communication with Friends and Family: More than three times a week    Frequency of Social Gatherings with Friends and Family: Once a week    Attends Religious Services: More than 4 times per year    Active Member of [employer] or Organizations: Yes    Attends Banker Meetings: 1 to 4 times per year    Marital Status: Married  Catering manager Violence: Not on file    ROS Review of Systems  Constitutional:  Negative for chills and fever.  HENT:  Positive for congestion and postnasal drip.   Eyes:  Negative for visual disturbance.  Respiratory:  Positive for wheezing. Negative for cough and shortness of breath.   Cardiovascular:  Negative for chest pain.  Gastrointestinal:  Positive for abdominal pain. Negative for blood in stool, constipation and nausea.    Objective:   Today's Vitals: BP 118/76   Pulse 87   Temp 97.7 F (36.5 C) (Oral)   Resp 16   Ht  (1.626 m)   Wt 195 lb 8 oz (88.7 kg)   SpO2 95%   BMI 33.56 kg/m   Physical Exam Constitutional:      Appearance: Normal appearance.  HENT:     Head: Normocephalic and atraumatic.     Mouth/Throat:     Mouth: Mucous membranes are moist.     Pharynx: Posterior oropharyngeal erythema present.  Eyes:     Conjunctiva/sclera: Conjunctivae normal.  Cardiovascular:     Rate and Rhythm: Normal rate and regular rhythm.  Pulmonary:     Effort: Pulmonary effort is normal.     Breath sounds: Normal breath sounds.  Skin:    General: Skin is warm and dry.  Neurological:     General: No focal deficit present.     Mental Status: She is alert. Mental status is at baseline.  Psychiatric:         Mood and Affect: Mood normal.        Behavior: Behavior normal.     Assessment & Plan:   1. Epigastric pain: Symptoms better but not resolved. Will treat like GERD symptoms with Protonix 40 mg daily and follow up in 3 months. If no better will require imaging.   - pantoprazole (PROTONIX) 40 MG tablet; Take 1 tablet (40 mg total) by mouth daily.  Dispense: 90 tablet; Refill: 0  2. Type 2 diabetes mellitus with hyperglycemia, without long-term current use of insulin: Stable, continue Ozmepic  0.5 mg weekly for now.   3. Seasonal allergies: Switch to Zyrtec at night, Singulair in the morning. Refill Albuterol to use as needed, sample of Breztri to use in the morning while she is symptomatic.   - albuterol (VENTOLIN HFA) 108 (90 Base) MCG/ACT inhaler; Inhale 1-2 puffs into the lungs every 6 (six) hours as needed.  Dispense: 1 each; Refill: 3 - montelukast (SINGULAIR) 10 MG tablet; Take 1 tablet (10 mg total) by mouth at bedtime.  Dispense: 30 tablet; Refill: 3   Follow-up: Return in about 3 months (around 04/06/2023).   Margarita Mail, DO

## 2023-01-05 ENCOUNTER — Ambulatory Visit (INDEPENDENT_AMBULATORY_CARE_PROVIDER_SITE_OTHER): Payer: BLUE CROSS/BLUE SHIELD | Admitting: Internal Medicine

## 2023-01-05 ENCOUNTER — Encounter: Payer: Self-pay | Admitting: Internal Medicine

## 2023-01-05 VITALS — BP 118/76 | HR 87 | Temp 97.7°F | Resp 16 | Ht 64.0 in | Wt 195.5 lb

## 2023-01-05 DIAGNOSIS — J302 Other seasonal allergic rhinitis: Secondary | ICD-10-CM | POA: Diagnosis not present

## 2023-01-05 DIAGNOSIS — R1013 Epigastric pain: Secondary | ICD-10-CM

## 2023-01-05 DIAGNOSIS — E1165 Type 2 diabetes mellitus with hyperglycemia: Secondary | ICD-10-CM | POA: Diagnosis not present

## 2023-01-05 MED ORDER — MONTELUKAST SODIUM 10 MG PO TABS: 10.0000 mg | ORAL_TABLET | Freq: Every day | ORAL | 3 refills | Status: DC

## 2023-01-05 MED ORDER — ALBUTEROL SULFATE HFA 108 (90 BASE) MCG/ACT IN AERS: 1.0000 | INHALATION_SPRAY | Freq: Four times a day (QID) | RESPIRATORY_TRACT | 3 refills | Status: DC | PRN

## 2023-01-05 MED ORDER — PANTOPRAZOLE SODIUM 40 MG PO TBEC: 40.0000 mg | DELAYED_RELEASE_TABLET | Freq: Every day | ORAL | 0 refills | Status: DC

## 2023-01-05 NOTE — Patient Instructions (Addendum)
It was great seeing you today!  Plan discussed at today's visit: -Start Protonix 40 mg daily -Start Zyrtec at night and Singuliar during the day -Use sample inhaler 1 puff daily while symptomatic and then Albuterol every 4-6 hours as needed  Follow up in: 3 months   Take care and let us know if you have any questions or concerns prior to your next visit.  Dr. Caralee Ates

## 2023-01-08 ENCOUNTER — Encounter: Payer: Self-pay | Admitting: Gastroenterology

## 2023-01-22 ENCOUNTER — Other Ambulatory Visit: Payer: Self-pay | Admitting: Internal Medicine

## 2023-01-22 DIAGNOSIS — E1165 Type 2 diabetes mellitus with hyperglycemia: Secondary | ICD-10-CM

## 2023-01-23 NOTE — Telephone Encounter (Signed)
Requested Prescriptions  Pending Prescriptions Disp Refills   OZEMPIC, 0.25 OR 0.5 MG/DOSE, 2 MG/3ML SOPN [Pharmacy Med Name: OZEMPIC (0.25 OR 0.5 MG/DOSE 2 SOPN] 3 mL 0    Sig: INJECT 0.5MG  UNDER THE SKIN ONCE WEEKLY     Endocrinology:  Diabetes - GLP-1 Receptor Agonists - semaglutide Failed - 01/22/2023 10:50 AM      Failed - HBA1C in normal range and within 180 days    Hemoglobin A1C  Date Value Ref Range Status  12/08/2022 5.9 (A) 4.0 - 5.6 % Final   Hgb A1c MFr Bld  Date Value Ref Range Status  06/23/2022 5.7 (H) <5.7 % of total Hgb Final    Comment:    For someone without known diabetes, a hemoglobin  A1c value between 5.7% and 6.4% is consistent with prediabetes and should be confirmed with a  follow-up test. . For someone with known diabetes, a value <7% indicates that their diabetes is well controlled. A1c targets should be individualized based on duration of diabetes, age, comorbid conditions, and other considerations. . This assay result is consistent with an increased risk of diabetes. . Currently, no consensus exists regarding use of hemoglobin A1c for diagnosis of diabetes for children. .          Passed - Cr in normal range and within 360 days    Creat  Date Value Ref Range Status  12/08/2022 0.73 0.50 - 0.99 mg/dL Final   Creatinine, Urine  Date Value Ref Range Status  06/23/2022 23 20 - 275 mg/dL Final         Passed - Valid encounter within last 6 months    Recent Outpatient Visits           2 weeks ago Epigastric pain   [facility] Margarita Mail, DO   4 weeks ago Upper respiratory tract infection, unspecified type   [facility] Danelle Berry, PA-C   1 month ago LUQ pain   [facility] Margarita Mail, DO   4 months ago Type 2 diabetes mellitus with hyperglycemia, without long-term current use of insulin [facility])   [facility]  Margarita Mail, DO   7 months ago Type 2 diabetes mellitus with hyperglycemia, without long-term current use of insulin [facility])    [facility] Margarita Mail, DO       Future Appointments             In 2 months Margarita Mail, DO [facility], [facility]

## 2023-04-08 NOTE — Progress Notes (Unsigned)
Established Patient Office Visit  Subjective:  Patient ID: Helen Rodgers, female    DOB: [birth year]  Age: 52 y.o. MRN: [id number]  CC:  No chief complaint on file.   HPI Helen Rodgers presents for follow up on chronic medical conditions.     Diabetes, Type 2: -First diagnosed in 2022 -Last A1c 5.9 3/24 -Medications: Ozempic decreased to 0.5 mg at LOV due to abdominal symptoms  Failed Meds: Metformin or Glipizide (had a lot of abdominal pain and bloating). -Patient is compliant with the above medications and reports no side effects.  -Checking BG at home: post-prandial 120's usually. Lowest 90  -Exercise: walks, works out on elliptical  -Diet: hard boiled eggs, fruit, small portions  -Eye exam: Follows in [place], UTD 3/23 -Foot exam: UTD 12/23 -Microalbumin: UTD 9/23 -Statin: None -PNA vaccine: 23 in 2022 -Denies symptoms of hypoglycemia, polyuria, polydipsia, numbness extremities, foot ulcers/trauma.   At last office visit we discussed episodic epigastric/LUQ pain. Patient states that since she has decreased the Ozempic it is better in severity but still present. Worse to the touch or if she is hugged or squeezed. Still a dull ache with occasional sharp pains. She denies changes in stool caliber, nausea/vomiting or fevers. Does have decreased appetite but that has been the case since being on the Ozempic. Denies changes in pain with eating. Did have an episode of dark stools once a few months ago, this has not reoccurred. She had a colonoscopy 12/29/22 and 2 polyps were removed but nothing else seen.   Is also having seasonal allergy symptoms, was seen here for upper respiratory infection last month, was treated with prednisone and mucinex. Still having congestion and throat clearing/post nasal drip.  Past Medical History:  Diagnosis Date   Allergy    Asthma    Back pain    Dermatitis of external ear    Diabetes mellitus without complication (HCC)     Past Surgical History:   Procedure Laterality Date   ABDOMINAL HYSTERECTOMY     BREAST BIOPSY Left    CHOLECYSTECTOMY     COLONOSCOPY WITH PROPOFOL N/A 12/29/2022   Procedure: COLONOSCOPY WITH PROPOFOL;  Surgeon: Toney Reil, MD;  Location: [facility] ENDOSCOPY;  Service: Gastroenterology;  Laterality: N/A;   TUBAL LIGATION      Family History  Problem Relation Age of Onset   Hyperlipidemia Mother    Diabetes Mother    Glaucoma Mother    Heart disease Mother    Hyperlipidemia Father    Breast cancer Paternal Aunt     Social History   Socioeconomic History   Marital status: Married    Spouse name: Not on file   Number of children: Not on file   Years of education: Not on file   Highest education level: Some college, no degree  Occupational History   Not on file  Tobacco Use   Smoking status: Never   Smokeless tobacco: Never  Vaping Use   Vaping Use: Never used  Substance and Sexual Activity   Alcohol use: Yes    Alcohol/week: 1.0 standard drink of alcohol    Types: 1 Glasses of wine per week    Comment: occasional   Drug use: Never   Sexual activity: Yes  Other Topics Concern   Not on file  Social History Narrative   Not on file   Social Determinants of Health   Financial Resource Strain: Low Risk  (01/04/2023)   Overall Financial Resource Strain (CARDIA)    Difficulty  of Paying Living Expenses: Not very hard  Food Insecurity: No Food Insecurity (01/04/2023)   Hunger Vital Sign    Worried About Running Out of Food in the Last Year: Never true    Ran Out of Food in the Last Year: Never true  Transportation Needs: No Transportation Needs (01/04/2023)   PRAPARE - Administrator, Civil Service (Medical): No    Lack of Transportation (Non-Medical): No  Physical Activity: Insufficiently Active (01/04/2023)   Exercise Vital Sign    Days of Exercise per Week: 1 day    Minutes of Exercise per Session: 30 min  Stress: No Stress Concern Present (01/04/2023)   [employer] of  Occupational Health - Occupational Stress Questionnaire    Feeling of Stress : Only a little  Social Connections: Socially Integrated (01/04/2023)   Social Connection and Isolation Panel [NHANES]    Frequency of Communication with Friends and Family: More than three times a week    Frequency of Social Gatherings with Friends and Family: Once a week    Attends Religious Services: More than 4 times per year    Active Member of [employer] or Organizations: Yes    Attends Banker Meetings: 1 to 4 times per year    Marital Status: Married  Catering manager Violence: Not on file    ROS Review of Systems  Constitutional:  Negative for chills and fever.  HENT:  Positive for congestion and postnasal drip.   Eyes:  Negative for visual disturbance.  Respiratory:  Positive for wheezing. Negative for cough and shortness of breath.   Cardiovascular:  Negative for chest pain.  Gastrointestinal:  Positive for abdominal pain. Negative for blood in stool, constipation and nausea.    Objective:   Today's Vitals: There were no vitals taken for this visit.  Physical Exam Constitutional:      Appearance: Normal appearance.  HENT:     Head: Normocephalic and atraumatic.     Mouth/Throat:     Mouth: Mucous membranes are moist.     Pharynx: Posterior oropharyngeal erythema present.  Eyes:     Conjunctiva/sclera: Conjunctivae normal.  Cardiovascular:     Rate and Rhythm: Normal rate and regular rhythm.  Pulmonary:     Effort: Pulmonary effort is normal.     Breath sounds: Normal breath sounds.  Skin:    General: Skin is warm and dry.  Neurological:     General: No focal deficit present.     Mental Status: She is alert. Mental status is at baseline.  Psychiatric:        Mood and Affect: Mood normal.        Behavior: Behavior normal.     Assessment & Plan:   1. Epigastric pain: Symptoms better but not resolved. Will treat like GERD symptoms with Protonix 40 mg daily and follow up in  3 months. If no better will require imaging.   - pantoprazole (PROTONIX) 40 MG tablet; Take 1 tablet (40 mg total) by mouth daily.  Dispense: 90 tablet; Refill: 0  2. Type 2 diabetes mellitus with hyperglycemia, without long-term current use of insulin: Stable, continue Ozmepic 0.5 mg weekly for now.   3. Seasonal allergies: Switch to Zyrtec at night, Singulair in the morning. Refill Albuterol to use as needed, sample of Breztri to use in the morning while she is symptomatic.   - albuterol (VENTOLIN HFA) 108 (90 Base) MCG/ACT inhaler; Inhale 1-2 puffs into the lungs every 6 (six) hours as needed.  Dispense: 1 each; Refill: 3 - montelukast (SINGULAIR) 10 MG tablet; Take 1 tablet (10 mg total) by mouth at bedtime.  Dispense: 30 tablet; Refill: 3   Follow-up: No follow-ups on file.   Margarita Mail, DO

## 2023-04-09 ENCOUNTER — Encounter: Payer: Self-pay | Admitting: Internal Medicine

## 2023-04-09 ENCOUNTER — Ambulatory Visit (INDEPENDENT_AMBULATORY_CARE_PROVIDER_SITE_OTHER): Payer: BC Managed Care – PPO | Admitting: Internal Medicine

## 2023-04-09 VITALS — BP 128/78 | HR 76 | Temp 97.4°F | Resp 26 | Wt 205.3 lb

## 2023-04-09 DIAGNOSIS — E1165 Type 2 diabetes mellitus with hyperglycemia: Secondary | ICD-10-CM | POA: Diagnosis not present

## 2023-04-09 DIAGNOSIS — R1013 Epigastric pain: Secondary | ICD-10-CM | POA: Diagnosis not present

## 2023-04-09 DIAGNOSIS — Z7985 Long-term (current) use of injectable non-insulin antidiabetic drugs: Secondary | ICD-10-CM

## 2023-04-09 LAB — POCT GLYCOSYLATED HEMOGLOBIN (HGB A1C): Hemoglobin A1C: 5.9 % — AB (ref 4.0–5.6)

## 2023-04-09 MED ORDER — NAPROXEN 500 MG PO TABS: 500.0000 mg | ORAL_TABLET | Freq: Every day | ORAL | 0 refills | Status: DC | PRN

## 2023-04-09 MED ORDER — PANTOPRAZOLE SODIUM 20 MG PO TBEC: 20.0000 mg | DELAYED_RELEASE_TABLET | Freq: Every day | ORAL | 0 refills | Status: DC

## 2023-04-10 ENCOUNTER — Other Ambulatory Visit: Payer: Self-pay | Admitting: Internal Medicine

## 2023-04-10 DIAGNOSIS — Z1231 Encounter for screening mammogram for malignant neoplasm of breast: Secondary | ICD-10-CM

## 2023-04-25 ENCOUNTER — Ambulatory Visit
Admission: RE | Admit: 2023-04-25 | Discharge: 2023-04-25 | Disposition: A | Discharge status: Home / Self Care | Payer: BC Managed Care – PPO | Source: Ambulatory Visit | Attending: Internal Medicine | Admitting: Internal Medicine

## 2023-04-25 ENCOUNTER — Ambulatory Visit
Admission: RE | Admit: 2023-04-25 | Discharge: 2023-04-25 | Discharge status: Home / Self Care | Payer: BC Managed Care – PPO | Source: Ambulatory Visit | Attending: Internal Medicine

## 2023-04-25 DIAGNOSIS — R1013 Epigastric pain: Secondary | ICD-10-CM | POA: Diagnosis present

## 2023-04-25 DIAGNOSIS — Z1231 Encounter for screening mammogram for malignant neoplasm of breast: Secondary | ICD-10-CM | POA: Diagnosis present

## 2023-05-03 ENCOUNTER — Telehealth: Payer: Self-pay | Admitting: Internal Medicine

## 2023-05-03 NOTE — Telephone Encounter (Signed)
Copied from CRM [id number]. Topic: General - Other >> May 03, 2023 12:59 PM Epimenio Foot F wrote: Reason for CRM: Pt is calling in requesting for Dr. Caralee Ates' nurse to give her a call back. Please follow up with pt.

## 2023-05-22 ENCOUNTER — Ambulatory Visit: Payer: BC Managed Care – PPO | Admitting: Internal Medicine

## 2023-05-22 ENCOUNTER — Other Ambulatory Visit: Payer: Self-pay | Admitting: Internal Medicine

## 2023-05-22 DIAGNOSIS — E1165 Type 2 diabetes mellitus with hyperglycemia: Secondary | ICD-10-CM

## 2023-05-23 NOTE — Progress Notes (Unsigned)
Established Patient Office Visit  Subjective:  Patient ID: Helen Rodgers, female    DOB: [birth year]  Age: 52 y.o. MRN: [id number]  CC:  No chief complaint on file.   HPI Helen Rodgers presents for follow up on chronic medical conditions.    Diabetes, Type 2: -First diagnosed in 2022 -Last A1c 5.9 7/24 -Medications: Ozempic decreased to 0.5 mg every other week due to abdominal symptoms. Abdominal US from last month negative other than fatty liver, on Protonix 20 mg -Failed Meds: Metformin or Glipizide (had a lot of abdominal pain and bloating). -Patient is compliant with the above medications and reports no side effects.  -Checking BG at home: post-prandial 120's usually. Lowest 90  -Exercise: walks, works out on elliptical  -Diet: hard boiled eggs, fruit, small portions but has been more hungry and has gained weight since LOV  -Eye exam: Follows in [place], UTD 3/23 -Foot exam: UTD 12/23 -Microalbumin: UTD 9/23 -Statin: None -PNA vaccine: 23 in 2022 -Denies symptoms of hypoglycemia, polyuria, polydipsia, numbness extremities, foot ulcers/trauma.   At last office visit we discussed episodic epigastric/LUQ pain. Patient states that since she has decreased the Ozempic it is better in severity but still present. Worse to the touch or laying on her stomach. Spacing out Ozempic does help but still has symptoms. Started on Protonix 40 mg at LOV which relieved symptoms minimally.  Past Medical History:  Diagnosis Date   Allergy    Asthma    Back pain    Dermatitis of external ear    Diabetes mellitus without complication (HCC)     Past Surgical History:  Procedure Laterality Date   ABDOMINAL HYSTERECTOMY     BREAST BIOPSY Left    CHOLECYSTECTOMY     COLONOSCOPY WITH PROPOFOL N/A 12/29/2022   Procedure: COLONOSCOPY WITH PROPOFOL;  Surgeon: Toney Reil, MD;  Location: [facility] ENDOSCOPY;  Service: Gastroenterology;  Laterality: N/A;   TUBAL LIGATION      Family History   Problem Relation Age of Onset   Hyperlipidemia Mother    Diabetes Mother    Glaucoma Mother    Heart disease Mother    Hyperlipidemia Father    Breast cancer Paternal Aunt     Social History   Socioeconomic History   Marital status: Married    Spouse name: Not on file   Number of children: Not on file   Years of education: Not on file   Highest education level: Some college, no degree  Occupational History   Not on file  Tobacco Use   Smoking status: Never   Smokeless tobacco: Never  Vaping Use   Vaping status: Never Used  Substance and Sexual Activity   Alcohol use: Yes    Alcohol/week: 1.0 standard drink of alcohol    Types: 1 Glasses of wine per week    Comment: occasional   Drug use: Never   Sexual activity: Yes  Other Topics Concern   Not on file  Social History Narrative   Not on file   Social Determinants of Health   Financial Resource Strain: Low Risk  (01/04/2023)   Overall Financial Resource Strain (CARDIA)    Difficulty of Paying Living Expenses: Not very hard  Food Insecurity: No Food Insecurity (01/04/2023)   Hunger Vital Sign    Worried About Running Out of Food in the Last Year: Never true    Ran Out of Food in the Last Year: Never true  Transportation Needs: No Transportation Needs (01/04/2023)   PRAPARE -  Administrator, Civil Service (Medical): No    Lack of Transportation (Non-Medical): No  Physical Activity: Insufficiently Active (01/04/2023)   Exercise Vital Sign    Days of Exercise per Week: 1 day    Minutes of Exercise per Session: 30 min  Stress: No Stress Concern Present (01/04/2023)   [employer] of Occupational Health - Occupational Stress Questionnaire    Feeling of Stress : Only a little  Social Connections: Socially Integrated (01/04/2023)   Social Connection and Isolation Panel [NHANES]    Frequency of Communication with Friends and Family: More than three times a week    Frequency of Social Gatherings with Friends and  Family: Once a week    Attends Religious Services: More than 4 times per year    Active Member of [employer] or Organizations: Yes    Attends Banker Meetings: 1 to 4 times per year    Marital Status: Married  Catering manager Violence: Not At Risk (09/22/2020)   [facility], [facility]   Humiliation, Afraid, Rape, and Kick questionnaire    Fear of Current or Ex-Partner: No    Emotionally Abused: No    Physically Abused: No    Sexually Abused: No    ROS Review of Systems  Constitutional:  Negative for chills and fever.  Eyes:  Negative for visual disturbance.  Respiratory:  Negative for shortness of breath.   Cardiovascular:  Negative for chest pain.  Gastrointestinal:  Positive for abdominal pain. Negative for blood in stool, constipation, diarrhea, nausea and vomiting.  Genitourinary:  Negative for dysuria.    Objective:   Today's Vitals: There were no vitals taken for this visit.  Physical Exam Constitutional:      Appearance: Normal appearance.  HENT:     Head: Normocephalic and atraumatic.  Eyes:     Conjunctiva/sclera: Conjunctivae normal.  Cardiovascular:     Rate and Rhythm: Normal rate and regular rhythm.  Pulmonary:     Effort: Pulmonary effort is normal.     Breath sounds: Normal breath sounds.  Skin:    General: Skin is warm and dry.  Neurological:     General: No focal deficit present.     Mental Status: She is alert. Mental status is at baseline.  Psychiatric:        Mood and Affect: Mood normal.        Behavior: Behavior normal.     Assessment & Plan:   1. Type 2 diabetes mellitus with hyperglycemia, without long-term current use of insulin (HCC): A1c here today stable at 5.9%. Patient has gained weight however increasing the Ozempic dose is not an option due to side effects. Discussed stopping medication altogether or switching to something else. Patient wants to continue Ozempic 0.5 mg every other week until her  current supply is finished, follow up in 6 weeks to discuss different treatment options.   - POCT HgB A1C  2. Epigastric pain: I feel gastric symptoms are due to Ozempic, patient wanting imaging to rule out other pathology. Will obtain abdominal US to visualize liver, pancreatis, LUQ. Decrease Protonix to 20 mg as it does not appear to be helping much, plan to discontinue at follow up. Patient complaining of menstrual like cramps that only respond to Naproxen - history of hysterectomy but still has lower abdominal cramping about once a month for 1 day. Vaginal [place] in 2023 negative for ovarian pathology.   - US Abdomen Complete; Future - pantoprazole (  PROTONIX) 20 MG tablet; Take 1 tablet (20 mg total) by mouth daily.  Dispense: 90 tablet; Refill: 0 - naproxen (NAPROSYN) 500 MG tablet; Take 1 tablet (500 mg total) by mouth daily as needed for moderate pain.  Dispense: 30 tablet; Refill: 0   Follow-up: No follow-ups on file.   Margarita Mail, DO

## 2023-05-23 NOTE — Telephone Encounter (Signed)
Requested Prescriptions  Pending Prescriptions Disp Refills   Semaglutide,0.25 or 0.5MG /DOS, (OZEMPIC, 0.25 OR 0.5 MG/DOSE,) 2 MG/3ML SOPN [Pharmacy Med Name: OZEMPIC (0.25 OR 0.5 MG/DOSE 2 SOPN] 3 mL 0    Sig: INJECT 0.5 MG UNDER THE SKIN ONCE WEEKLY     Endocrinology:  Diabetes - GLP-1 Receptor Agonists - semaglutide Failed - 05/22/2023  9:58 AM      Failed - HBA1C in normal range and within 180 days    Hemoglobin A1C  Date Value Ref Range Status  04/09/2023 5.9 (A) 4.0 - 5.6 % Final   Hgb A1c MFr Bld  Date Value Ref Range Status  06/23/2022 5.7 (H) <5.7 % of total Hgb Final    Comment:    For someone without known diabetes, a hemoglobin  A1c value between 5.7% and 6.4% is consistent with prediabetes and should be confirmed with a  follow-up test. . For someone with known diabetes, a value <7% indicates that their diabetes is well controlled. A1c targets should be individualized based on duration of diabetes, age, comorbid conditions, and other considerations. . This assay result is consistent with an increased risk of diabetes. . Currently, no consensus exists regarding use of hemoglobin A1c for diagnosis of diabetes for children. .          Passed - Cr in normal range and within 360 days    Creat  Date Value Ref Range Status  12/08/2022 0.73 0.50 - 0.99 mg/dL Final   Creatinine, Urine  Date Value Ref Range Status  06/23/2022 23 20 - 275 mg/dL Final         Passed - Valid encounter within last 6 months    Recent Outpatient Visits           1 month ago Type 2 diabetes mellitus with hyperglycemia, without long-term current use of insulin [facility])   [place] [facility] Margarita Mail, DO   4 months ago Epigastric pain   [place] [facility] Margarita Mail, DO   4 months ago Upper respiratory tract infection, unspecified type   [facility] Danelle Berry, PA-C   5 months ago LUQ pain   [facility] Margarita Mail, DO   8 months ago Type 2 diabetes mellitus with hyperglycemia, without long-term current use of insulin [facility])   [facility] Margarita Mail, DO       Future Appointments             Tomorrow Margarita Mail, DO [place], [facility]

## 2023-05-24 ENCOUNTER — Ambulatory Visit (INDEPENDENT_AMBULATORY_CARE_PROVIDER_SITE_OTHER): Payer: BC Managed Care – PPO | Admitting: Internal Medicine

## 2023-05-24 ENCOUNTER — Encounter: Payer: Self-pay | Admitting: Internal Medicine

## 2023-05-24 VITALS — BP 126/76 | HR 76 | Temp 97.6°F | Ht 64.0 in | Wt 202.7 lb

## 2023-05-24 DIAGNOSIS — E1165 Type 2 diabetes mellitus with hyperglycemia: Secondary | ICD-10-CM | POA: Diagnosis not present

## 2023-05-24 DIAGNOSIS — J302 Other seasonal allergic rhinitis: Secondary | ICD-10-CM

## 2023-05-24 DIAGNOSIS — Z7985 Long-term (current) use of injectable non-insulin antidiabetic drugs: Secondary | ICD-10-CM

## 2023-05-24 MED ORDER — MONTELUKAST SODIUM 10 MG PO TABS: 10.0000 mg | ORAL_TABLET | Freq: Every day | ORAL | 1 refills | Status: DC

## 2023-05-24 MED ORDER — LEVOCETIRIZINE DIHYDROCHLORIDE 5 MG PO TABS: 5.0000 mg | ORAL_TABLET | Freq: Every evening | ORAL | 1 refills | Status: DC

## 2023-05-24 MED ORDER — OZEMPIC (0.25 OR 0.5 MG/DOSE) 2 MG/3ML ~~LOC~~ SOPN: 0.5000 mg | PEN_INJECTOR | SUBCUTANEOUS | 1 refills | Status: DC

## 2023-07-20 ENCOUNTER — Ambulatory Visit: Payer: BC Managed Care – PPO | Admitting: Internal Medicine

## 2023-07-22 NOTE — Progress Notes (Unsigned)
Established Patient Office Visit  Subjective:  Patient ID: Helen Rodgers, female    DOB: [birth year]  Age: 52 y.o. MRN: [id number]  CC:  No chief complaint on file.   HPI Helen Rodgers presents for follow up on chronic medical conditions.    Diabetes, Type 2: -First diagnosed in 2022 -Last A1c 5.9 7/24 -Medications: Ozempic decreased to 0.5 mg every other week due to abdominal symptoms. Abdominal US from last month negative other than fatty liver, on Protonix 20 mg. Today patient states that she's been doing better, her abdominal symptoms have improved.  -Failed Meds: Metformin or Glipizide (had a lot of abdominal pain and bloating). -Patient is compliant with the above medications and reports no side effects.  -Checking BG at home: post-prandial 120's usually. Lowest 90  -Exercise: walks, works out on elliptical  -Diet: hard boiled eggs, fruit, small portions but has been more hungry and has gained weight since LOV  -Eye exam: Follows in [place], UTD 3/23 -Foot exam: UTD 12/23 -Microalbumin: UTD 9/23 -Statin: None -PNA vaccine: 23 in 2022 -Denies symptoms of hypoglycemia, polyuria, polydipsia, numbness extremities, foot ulcers/trauma.   Environmental allergies: -Was exacerbated again a few weeks ago, no symptoms now. -Currently taking Xyzal 5 mg as needed, singular 10 mg at night.  Does have Flonase and as Astelin nasal spray to use as needed.  Past Medical History:  Diagnosis Date   Allergy    Asthma    Back pain    Dermatitis of external ear    Diabetes mellitus without complication (HCC)     Past Surgical History:  Procedure Laterality Date   ABDOMINAL HYSTERECTOMY     BREAST BIOPSY Left    CHOLECYSTECTOMY     COLONOSCOPY WITH PROPOFOL N/A 12/29/2022   Procedure: COLONOSCOPY WITH PROPOFOL;  Surgeon: Toney Reil, MD;  Location: [facility] END[facility]COPY;  Service: Gastroenterology;  Laterality: N/A;   TUBAL LIGATION      Family History  Problem Relation Age of Onset    Hyperlipidemia Mother    Diabetes Mother    Glaucoma Mother    Heart disease Mother    Hyperlipidemia Father    Breast cancer Paternal Aunt     Social History   Socioeconomic History   Marital status: Married    Spouse name: Not on file   Number of children: Not on file   Years of education: Not on file   Highest education level: Some college, no degree  Occupational History   Not on file  Tobacco Use   Smoking status: Never   Smokeless tobacco: Never  Vaping Use   Vaping status: Never Used  Substance and Sexual Activity   Alcohol use: Yes    Alcohol/week: 1.0 standard drink of alcohol    Types: 1 Glasses of wine per week    Comment: occasional   Drug use: Never   Sexual activity: Yes  Other Topics Concern   Not on file  Social History Narrative   Not on file   Social Determinants of Health   Financial Resource Strain: Low Risk  (01/04/2023)   Overall Financial Resource Strain (CARDIA)    Difficulty of Paying Living Expenses: Not very hard  Food Insecurity: No Food Insecurity (01/04/2023)   Hunger Vital Sign    Worried About Running Out of Food in the Last Year: Never true    Ran Out of Food in the Last Year: Never true  Transportation Needs: No Transportation Needs (01/04/2023)   PRAPARE - Transportation  Lack of Transportation (Medical): No    Lack of Transportation (Non-Medical): No  Physical Activity: Insufficiently Active (01/04/2023)   Exercise Vital Sign    Days of Exercise per Week: 1 day    Minutes of Exercise per Session: 30 min  Stress: No Stress Concern Present (01/04/2023)   [employer] of Occupational Health - Occupational Stress Questionnaire    Feeling of Stress : Only a little  Social Connections: Socially Integrated (01/04/2023)   Social Connection and Isolation Panel [NHANES]    Frequency of Communication with Friends and Family: More than three times a week    Frequency of Social Gatherings with Friends and Family: Once a week    Attends  Religious Services: More than 4 times per year    Active Member of [employer] or Organizations: Yes    Attends Banker Meetings: 1 to 4 times per year    Marital Status: Married  Catering manager Violence: Not At Risk (09/22/2020)   Received from [facility], [facility]   Humiliation, Afraid, Rape, and Kick questionnaire    Fear of Current or Ex-Partner: No    Emotionally Abused: No    Physically Abused: No    Sexually Abused: No    ROS Review of Systems  Constitutional:  Negative for chills and fever.  Eyes:  Negative for visual disturbance.  Respiratory:  Negative for shortness of breath.   Cardiovascular:  Negative for chest pain.  Gastrointestinal:  Negative for abdominal pain, blood in stool, constipation, diarrhea, nausea and vomiting.  Genitourinary:  Negative for dysuria.    Objective:   Today's Vitals: There were no vitals taken for this visit.  Physical Exam Constitutional:      Appearance: Normal appearance.  HENT:     Head: Normocephalic and atraumatic.  Eyes:     Conjunctiva/sclera: Conjunctivae normal.  Cardiovascular:     Rate and Rhythm: Normal rate and regular rhythm.  Pulmonary:     Effort: Pulmonary effort is normal.     Breath sounds: Normal breath sounds.  Skin:    General: Skin is warm and dry.  Neurological:     General: No focal deficit present.     Mental Status: She is alert. Mental status is at baseline.  Psychiatric:        Mood and Affect: Mood normal.        Behavior: Behavior normal.     Assessment & Plan:   1. Type 2 diabetes mellitus with hyperglycemia, without long-term current use of insulin [facility]): Abdominal pain has improved, will continue Ozempic 0.5 mg weekly with Protonix 20 mg daily to help with side effects.  Patient will return in 2 months to recheck A1c.  - Semaglutide,0.25 or 0.5MG /DOS, (OZEMPIC, 0.25 OR 0.5 MG/DOSE,) 2 MG/3ML SOPN; Inject 0.5 mg into the skin once a week.  Dispense: 3 mL; Refill:  1  2. Seasonal allergies: Start Xyzal 5 mg daily as well as Singulair 10 mg at night.  These were sent to the pharmacy.  She can continue her nasal sprays as needed.  - levocetirizine (XYZAL) 5 MG tablet; Take 1 tablet (5 mg total) by mouth every evening.  Dispense: 90 tablet; Refill: 1 - montelukast (SINGULAIR) 10 MG tablet; Take 1 tablet (10 mg total) by mouth at bedtime.  Dispense: 90 tablet; Refill: 1   Follow-up: No follow-ups on file.   Margarita Mail, DO

## 2023-07-23 ENCOUNTER — Encounter: Payer: Self-pay | Admitting: Internal Medicine

## 2023-07-23 ENCOUNTER — Ambulatory Visit: Payer: BC Managed Care – PPO | Admitting: Internal Medicine

## 2023-07-23 VITALS — BP 122/78 | HR 100 | Temp 97.6°F | Resp 18 | Ht 64.0 in | Wt 205.4 lb

## 2023-07-23 DIAGNOSIS — Z23 Encounter for immunization: Secondary | ICD-10-CM

## 2023-07-23 DIAGNOSIS — Z7985 Long-term (current) use of injectable non-insulin antidiabetic drugs: Secondary | ICD-10-CM

## 2023-07-23 DIAGNOSIS — E1165 Type 2 diabetes mellitus with hyperglycemia: Secondary | ICD-10-CM

## 2023-07-23 LAB — POCT GLYCOSYLATED HEMOGLOBIN (HGB A1C): Hemoglobin A1C: 6.1 % — AB (ref 4.0–5.6)

## 2023-07-23 MED ORDER — OZEMPIC (0.25 OR 0.5 MG/DOSE) 2 MG/3ML ~~LOC~~ SOPN: 0.5000 mg | PEN_INJECTOR | SUBCUTANEOUS | 1 refills | Status: DC

## 2023-07-24 LAB — MICROALBUMIN / CREATININE URINE RATIO
Creatinine, Urine: 119 mg/dL (ref 20–275)
Microalb Creat Ratio: 8 mg/g{creat} (ref <30)
Microalb, Ur: 1 mg/dL

## 2023-08-03 ENCOUNTER — Telehealth: Payer: Self-pay | Admitting: Internal Medicine

## 2023-08-03 NOTE — Telephone Encounter (Signed)
A user error has taken place: encounter opened in error, closed for administrative reasons.

## 2023-08-08 ENCOUNTER — Telehealth: Payer: Self-pay | Admitting: Internal Medicine

## 2023-08-08 NOTE — Telephone Encounter (Signed)
Pt stated pharmacy advised her that medication Semaglutide, 0.5MG  needs PA. Stated missed last weeks dose.  Pt asked that the medication be transferred to pharmacy below as well. Please advise.  [facility] [phone] [facility], [place] - [address]  [address] [place] [address]  Phone: [phone] Fax: [phone]  Hours: Not open 24 hours

## 2023-08-09 NOTE — Telephone Encounter (Signed)
PA started

## 2023-08-10 ENCOUNTER — Other Ambulatory Visit: Payer: Self-pay

## 2023-08-10 ENCOUNTER — Encounter: Payer: Self-pay | Admitting: Internal Medicine

## 2023-08-10 ENCOUNTER — Other Ambulatory Visit: Payer: Self-pay | Admitting: Internal Medicine

## 2023-08-10 DIAGNOSIS — E1165 Type 2 diabetes mellitus with hyperglycemia: Secondary | ICD-10-CM

## 2023-08-10 MED ORDER — OZEMPIC (0.25 OR 0.5 MG/DOSE) 2 MG/3ML ~~LOC~~ SOPN: 0.5000 mg | PEN_INJECTOR | SUBCUTANEOUS | 1 refills | Status: DC

## 2023-08-14 NOTE — Telephone Encounter (Signed)
Patient has called to check the status of her Prior Authorization for medication Ozempic. Patient states she is due to take her next shot this Saturday and does not want to miss an injection. Please document status of prior authorization and follow back up with the patient for an update at phone # [phone] until 5 pm today and then home # [phone].

## 2023-08-15 NOTE — Telephone Encounter (Signed)
PA re submitted by phone went to pharmicis review 08/15/23

## 2023-08-15 NOTE — Telephone Encounter (Signed)
Pt.notified

## 2023-08-21 NOTE — Telephone Encounter (Signed)
Approved.  

## 2023-10-20 IMAGING — MG DIGITAL DIAGNOSTIC BILAT W/ TOMO W/ CAD
8 series · 8 of 24 positions shown · non-contrast
Comparison: Previous exam(s).

CLINICAL DATA: 50-year-old female presenting for two year follow-up
of a probably benign left breast apocrine metaplasia/fibrocystic
changes.

EXAM:
DIGITAL DIAGNOSTIC BILATERAL MAMMOGRAM WITH TOMOSYNTHESIS AND CAD;
ULTRASOUND LEFT BREAST LIMITED
TECHNIQUE: Bilateral digital diagnostic mammography and breast tomosynthesis
was performed. The images were evaluated with computer-aided
detection.; Targeted ultrasound examination of the left breast was
performed.

[R MLO synth-2D]
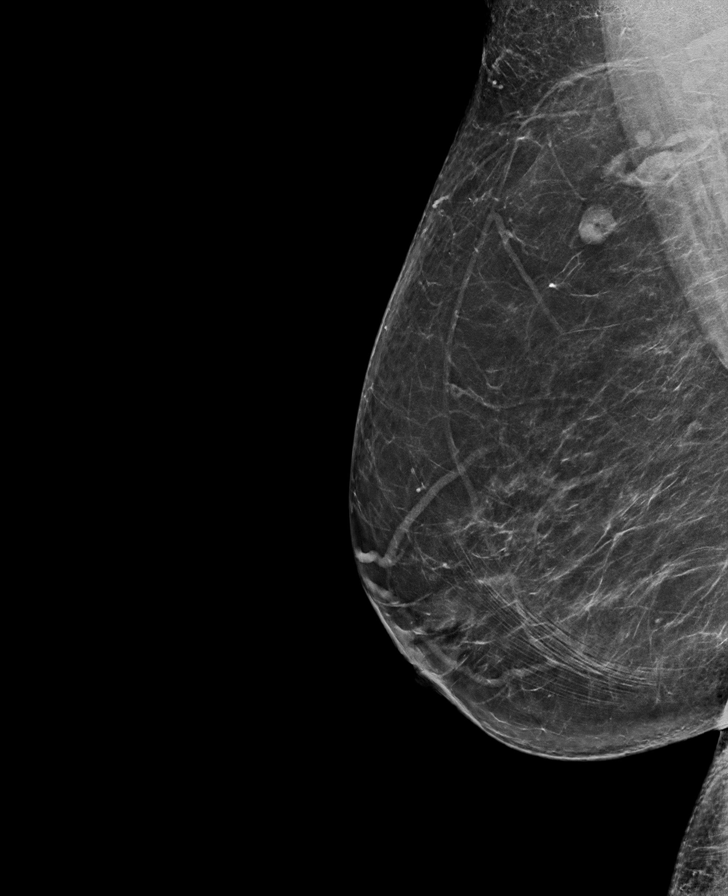

[L MLO synth-2D]
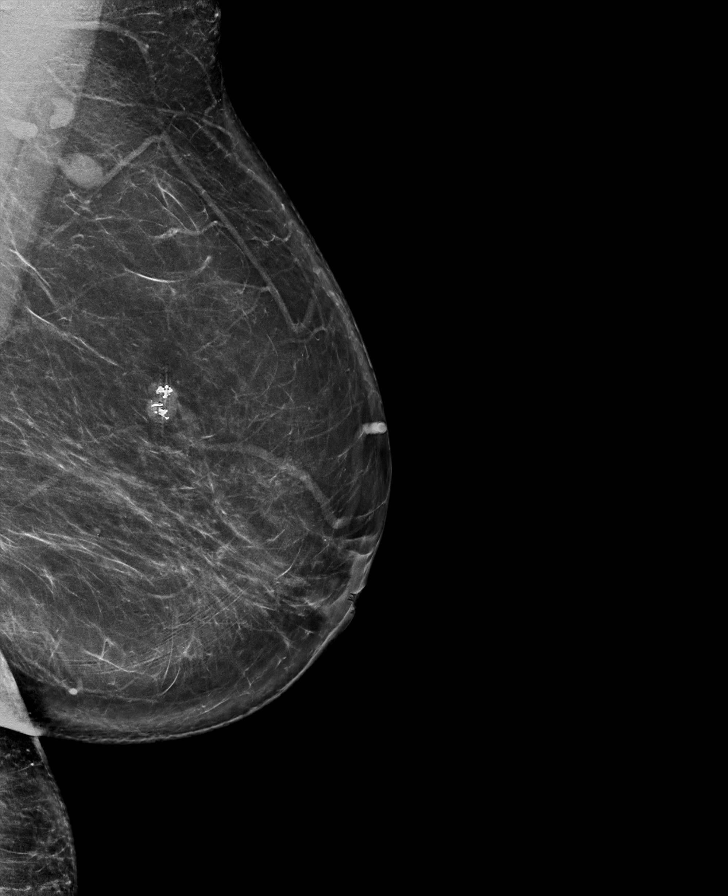

[L CC synth-2D]
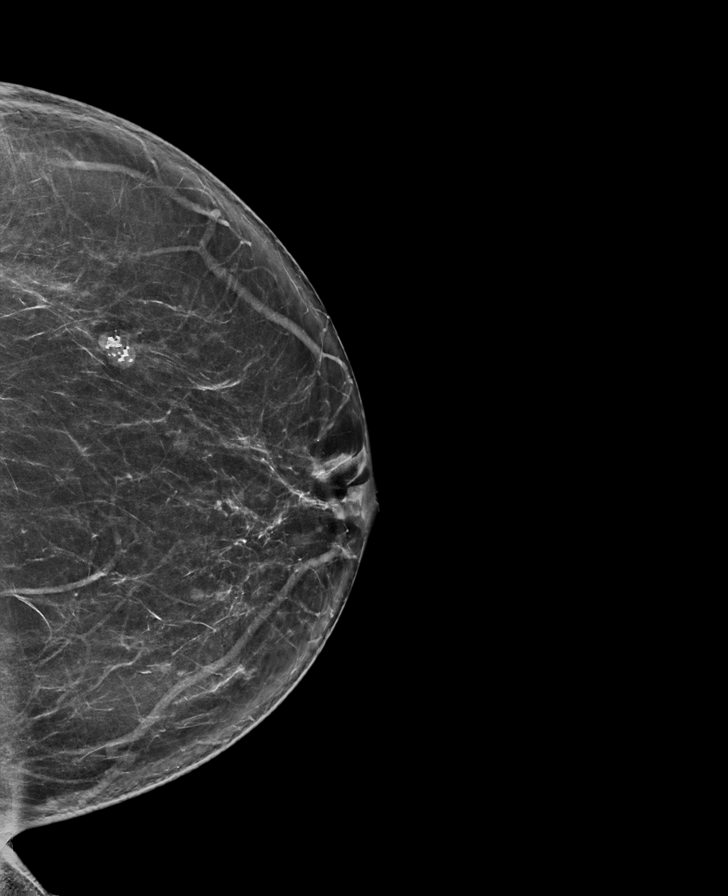

[R CC synth-2D]
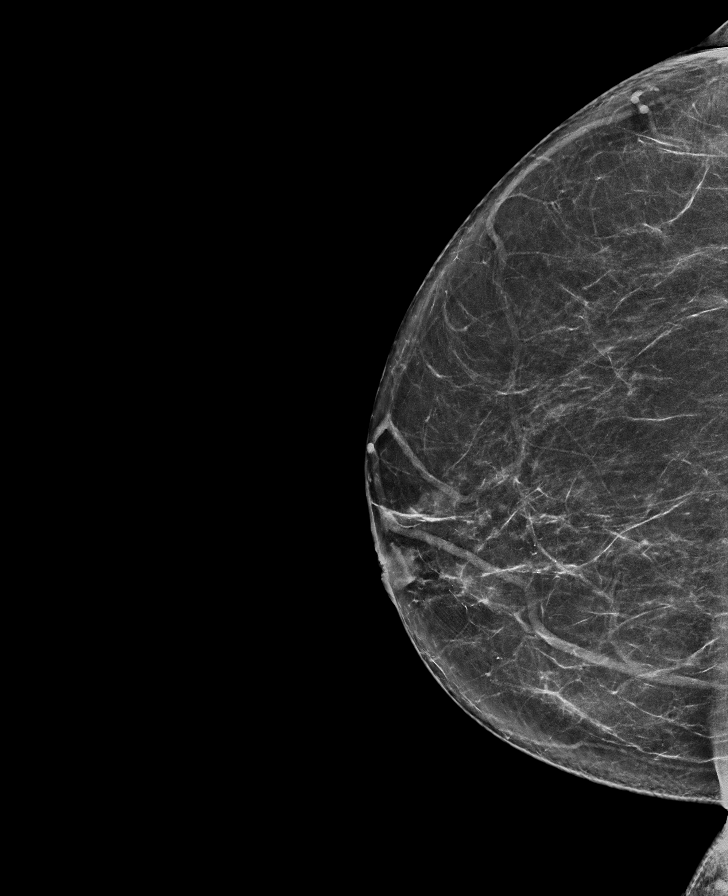

[L CC tomo · tomo slice 41/80.0]
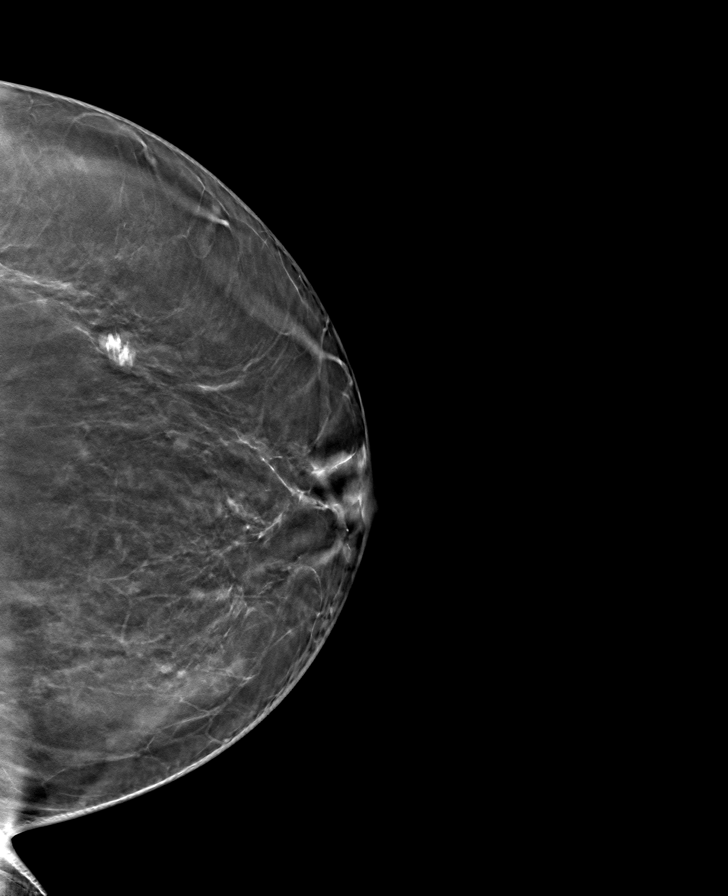

[R MLO tomo · tomo slice 42/83.0]
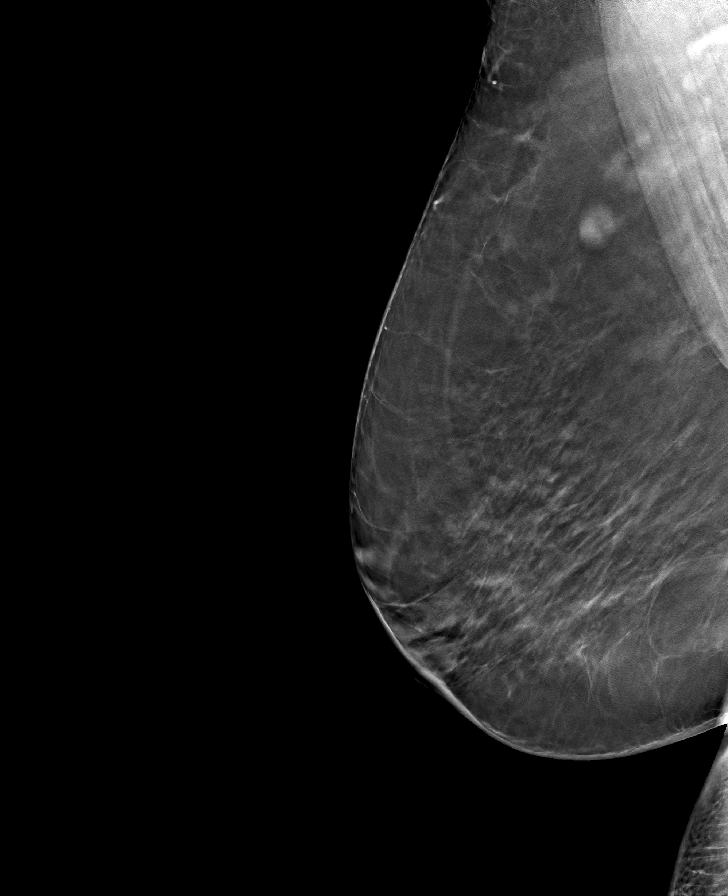

[L MLO tomo · tomo slice 44/87.0]
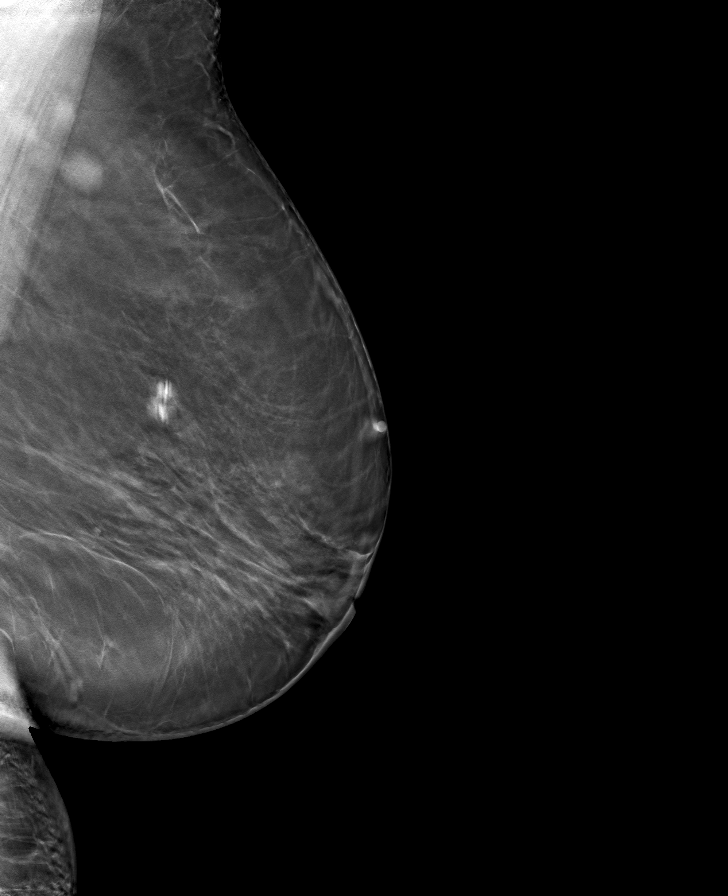

[R CC tomo · tomo slice 38/75.0]
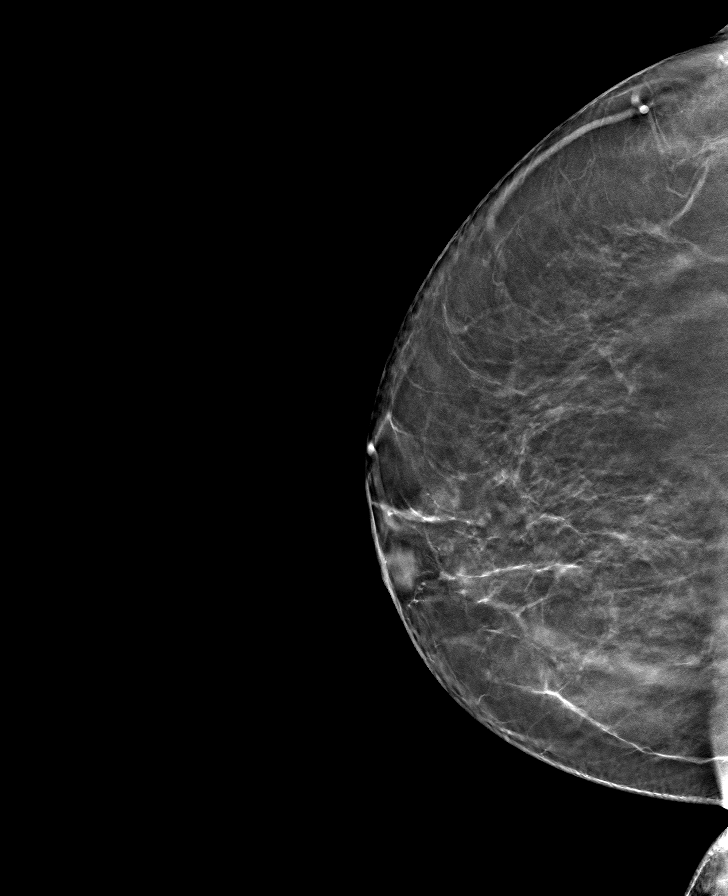

[8 of 24 positions shown; findings below may reference images not displayed]

ACR Breast Density Category b: There are scattered areas of
fibroglandular density.
FINDINGS: Mammogram:

Right breast: No suspicious mass, distortion, or microcalcifications
are identified to suggest presence of malignancy.

Left breast: There is a stable small asymmetry in the medial left
breast, slightly decreased in conspicuity compared to the prior
mammogram. There are no new suspicious findings elsewhere in the
left breast.

Ultrasound:

Targeted ultrasound is performed within the left breast at 8:30
o'clock demonstrating a mixed echogenicity mass measuring
approximately 0.6 x 0.3 x 0.4 cm, previously measuring 0.6 x 0.3 x
0.5 cm.
IMPRESSION: 1. Stable findings in the medial left breast most likely
representing apocrine metaplasia/fibrocystic changes. Given
stability for over 2 years this is considered benign.

2.  No mammographic evidence of malignancy bilaterally.

RECOMMENDATION:
Screening mammogram in one year.(Code:BF-Z-F5D)

I have discussed the findings and recommendations with the patient.
If applicable, a reminder letter will be sent to the patient
regarding the next appointment.

BI-RADS CATEGORY  2: Benign.

## 2023-10-20 IMAGING — US US BREAST*L* LIMITED INC AXILLA
1 series · 7 of 7 positions shown · non-contrast
Comparison: Previous exam(s).

CLINICAL DATA: 50-year-old female presenting for two year follow-up
of a probably benign left breast apocrine metaplasia/fibrocystic
changes.

EXAM:
DIGITAL DIAGNOSTIC BILATERAL MAMMOGRAM WITH TOMOSYNTHESIS AND CAD;
ULTRASOUND LEFT BREAST LIMITED
TECHNIQUE: Bilateral digital diagnostic mammography and breast tomosynthesis
was performed. The images were evaluated with computer-aided
detection.; Targeted ultrasound examination of the left breast was
performed.

[Series 1: us breast*left* limited inc axilla · 0.06mm/px · 7 of 7 slices shown]
[im 1/7]
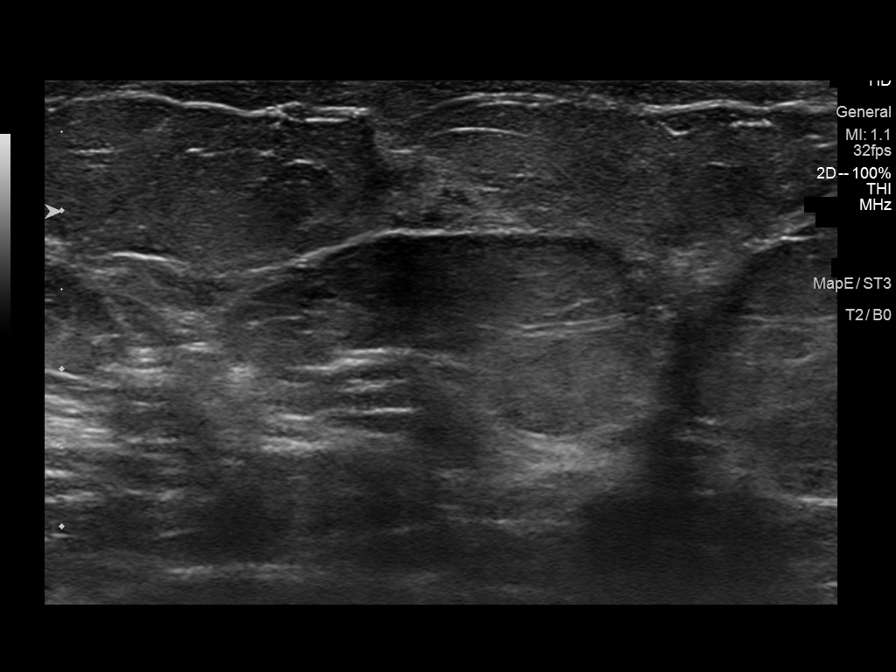
[im 2/7]
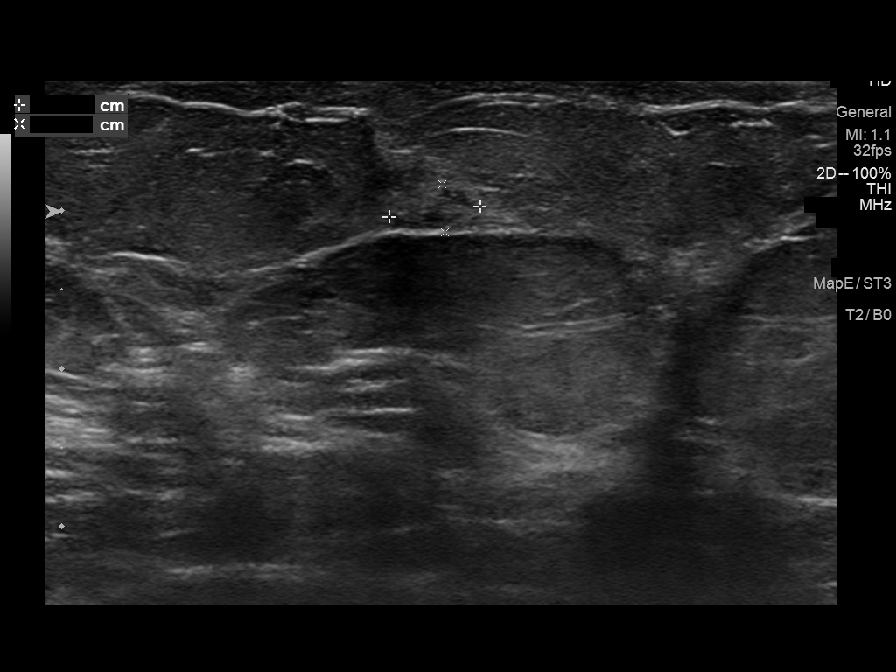
[im 3/7]
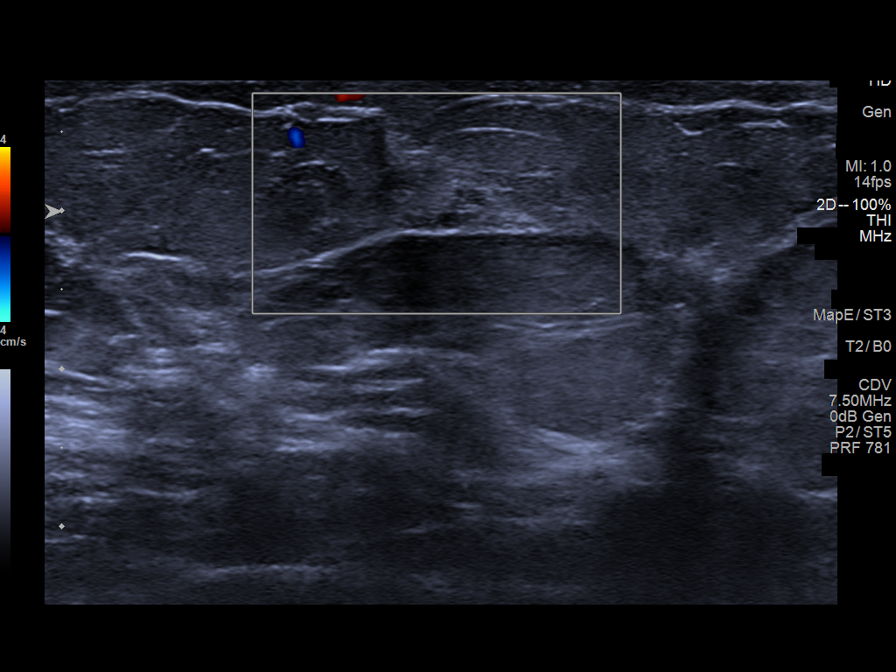
[im 4/7]
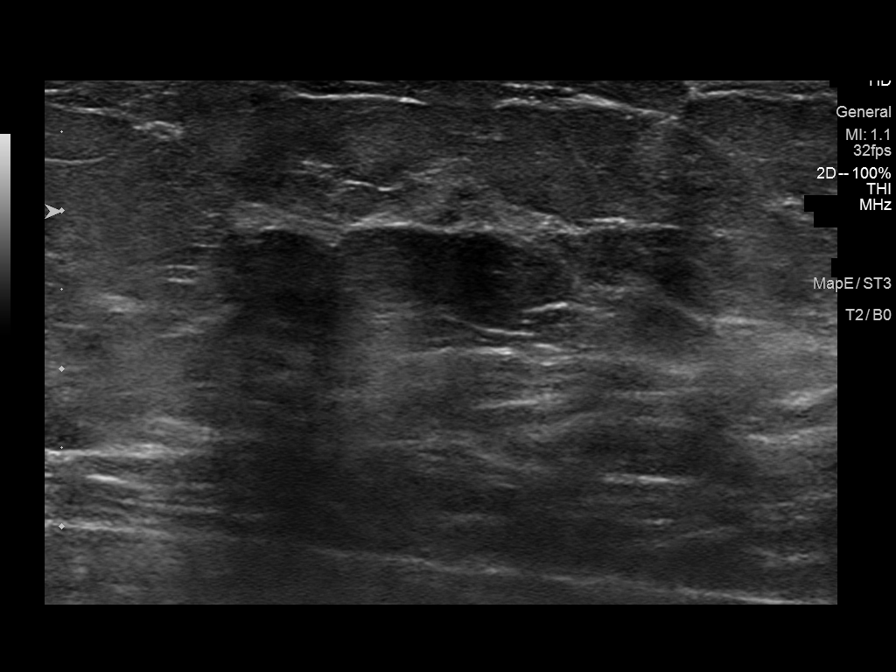
[im 5/7]
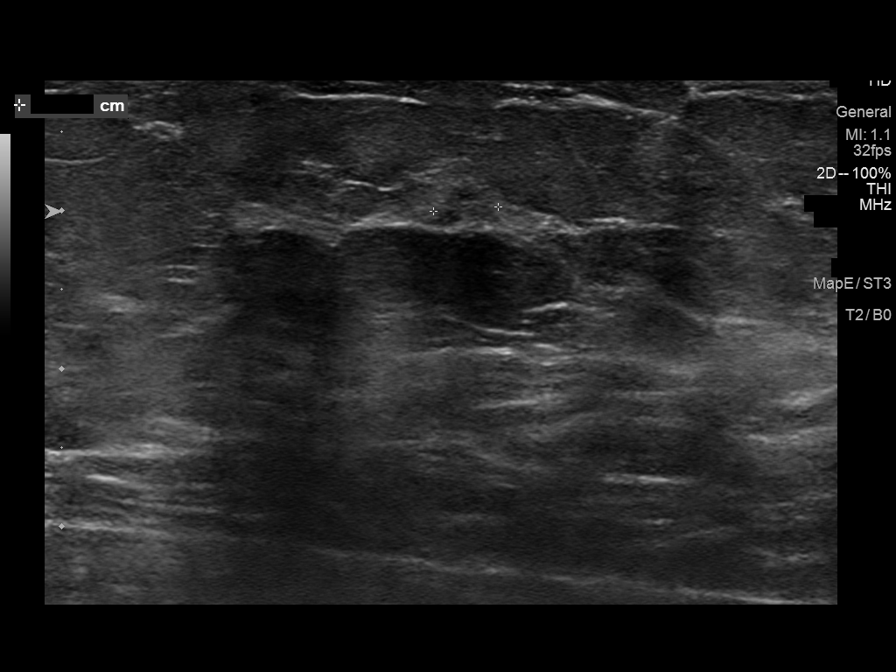
[im 6/7]
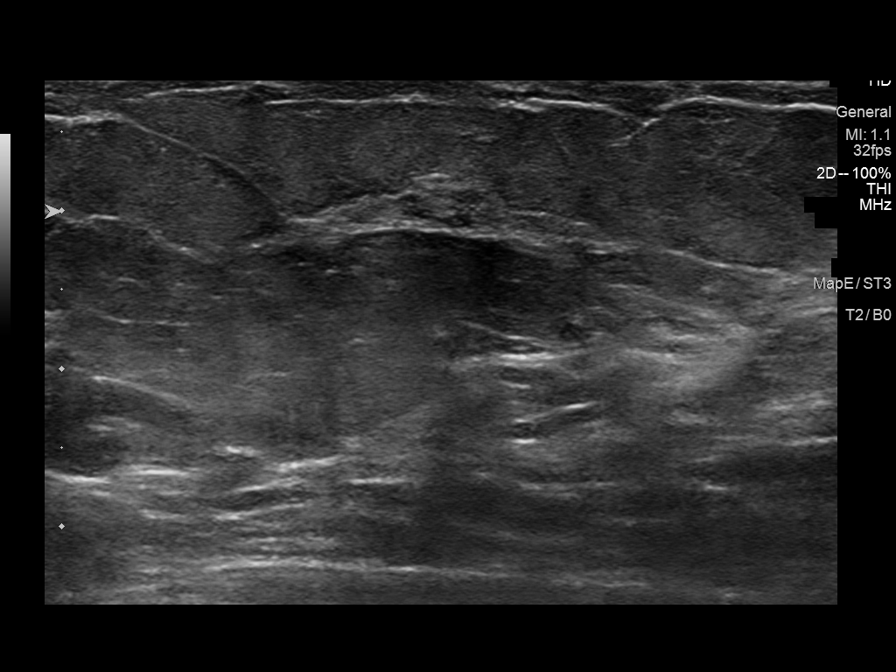
[im 7/7]
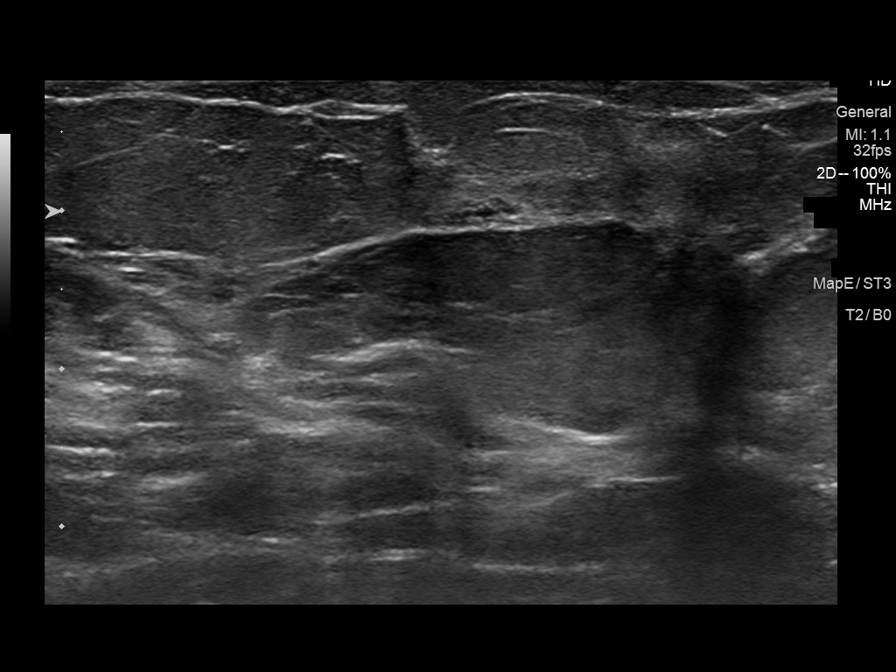

[7 of 7 positions shown; findings below may reference images not displayed]

ACR Breast Density Category b: There are scattered areas of
fibroglandular density.
FINDINGS: Mammogram:

Right breast: No suspicious mass, distortion, or microcalcifications
are identified to suggest presence of malignancy.

Left breast: There is a stable small asymmetry in the medial left
breast, slightly decreased in conspicuity compared to the prior
mammogram. There are no new suspicious findings elsewhere in the
left breast.

Ultrasound:

Targeted ultrasound is performed within the left breast at 8:30
o'clock demonstrating a mixed echogenicity mass measuring
approximately 0.6 x 0.3 x 0.4 cm, previously measuring 0.6 x 0.3 x
0.5 cm.
IMPRESSION: 1. Stable findings in the medial left breast most likely
representing apocrine metaplasia/fibrocystic changes. Given
stability for over 2 years this is considered benign.

2.  No mammographic evidence of malignancy bilaterally.

RECOMMENDATION:
Screening mammogram in one year.(Code:BF-Z-F5D)

I have discussed the findings and recommendations with the patient.
If applicable, a reminder letter will be sent to the patient
regarding the next appointment.

BI-RADS CATEGORY  2: Benign.

## 2024-01-01 ENCOUNTER — Other Ambulatory Visit: Payer: Self-pay | Admitting: Internal Medicine

## 2024-01-01 DIAGNOSIS — E1165 Type 2 diabetes mellitus with hyperglycemia: Secondary | ICD-10-CM

## 2024-01-03 NOTE — Telephone Encounter (Signed)
 Change in pharmacy requested  Requested Prescriptions  Pending Prescriptions Disp Refills   OZEMPIC, 0.25 OR 0.5 MG/DOSE, 2 MG/3ML SOPN [Pharmacy Med Name: Ozempic 0.25 mg or 0.5 mg (2 mg/3 mL) subcutaneous pen injector (semaglutide)] 3 mL 2    Sig: Inject 0.5 mg under the skin every seven (7) days.     Endocrinology:  Diabetes - GLP-1 Receptor Agonists - semaglutide Failed - 01/03/2024 10:44 AM      Failed - HBA1C in normal range and within 180 days    Hemoglobin A1C  Date Value Ref Range Status  07/23/2023 6.1 (A) 4.0 - 5.6 % Final   Hgb A1c MFr Bld  Date Value Ref Range Status  06/23/2022 5.7 (H) <5.7 % of total Hgb Final    Comment:    For someone without known diabetes, a hemoglobin  A1c value between 5.7% and 6.4% is consistent with prediabetes and should be confirmed with a  follow-up test. . For someone with known diabetes, a value <7% indicates that their diabetes is well controlled. A1c targets should be individualized based on duration of diabetes, age, comorbid conditions, and other considerations. . This assay result is consistent with an increased risk of diabetes. . Currently, no consensus exists regarding use of hemoglobin A1c for diagnosis of diabetes for children. .          Failed - Cr in normal range and within 360 days    Creat  Date Value Ref Range Status  12/08/2022 0.73 0.50 - 0.99 mg/dL Final   Creatinine, Urine  Date Value Ref Range Status  07/23/2023 119 20 - 275 mg/dL Final         Failed - Valid encounter within last 6 months    Recent Outpatient Visits   None     Future Appointments             In 3 weeks Margarita Mail, DO [facility], [facility]

## 2024-01-21 ENCOUNTER — Ambulatory Visit: Payer: BC Managed Care – PPO | Admitting: Internal Medicine

## 2024-01-25 ENCOUNTER — Other Ambulatory Visit: Payer: Self-pay

## 2024-01-25 ENCOUNTER — Encounter: Payer: Self-pay | Admitting: Internal Medicine

## 2024-01-25 ENCOUNTER — Ambulatory Visit: Payer: Self-pay | Admitting: Internal Medicine

## 2024-01-25 VITALS — BP 124/72 | HR 80 | Temp 97.8°F | Resp 16 | Ht 64.0 in | Wt 209.0 lb

## 2024-01-25 DIAGNOSIS — J302 Other seasonal allergic rhinitis: Secondary | ICD-10-CM | POA: Diagnosis not present

## 2024-01-25 DIAGNOSIS — Z7985 Long-term (current) use of injectable non-insulin antidiabetic drugs: Secondary | ICD-10-CM

## 2024-01-25 DIAGNOSIS — E782 Mixed hyperlipidemia: Secondary | ICD-10-CM | POA: Diagnosis not present

## 2024-01-25 DIAGNOSIS — E559 Vitamin D deficiency, unspecified: Secondary | ICD-10-CM | POA: Diagnosis not present

## 2024-01-25 DIAGNOSIS — E1165 Type 2 diabetes mellitus with hyperglycemia: Secondary | ICD-10-CM

## 2024-01-25 MED ORDER — MONTELUKAST SODIUM 10 MG PO TABS: 10.0000 mg | ORAL_TABLET | Freq: Every day | ORAL | 1 refills | Status: AC

## 2024-01-25 MED ORDER — ALBUTEROL SULFATE HFA 108 (90 BASE) MCG/ACT IN AERS: 1.0000 | INHALATION_SPRAY | Freq: Four times a day (QID) | RESPIRATORY_TRACT | 3 refills | Status: AC | PRN

## 2024-01-25 MED ORDER — TIRZEPATIDE 5 MG/0.5ML ~~LOC~~ SOAJ: 5.0000 mg | SUBCUTANEOUS | 1 refills | Status: DC

## 2024-01-25 MED ORDER — LEVOCETIRIZINE DIHYDROCHLORIDE 5 MG PO TABS: 5.0000 mg | ORAL_TABLET | Freq: Every evening | ORAL | 1 refills | Status: AC

## 2024-01-25 NOTE — Patient Instructions (Addendum)
 It was great seeing you today!  Plan discussed at today's visit: -Blood work ordered today, results will be uploaded to MyChart.  -Plan to switch from Ozempic  to Mounjaro 5 mg weekly if insurance will cover - finish what you have of the Ozempic  and then switch over -Recommend over the counter benzyl peroxide and/or pimple patch with microdarts to help with acne -Consider pneumonia vaccine in the fall   Follow up in: 9-12 weeks   Take care and let us  know if you have any questions or concerns prior to your next visit.  Dr. Bud Care

## 2024-01-25 NOTE — Progress Notes (Signed)
 Established Patient Office Visit  Subjective:  Patient ID: Helen Rodgers, female    DOB: 1971/07/31  Age: 53 y.o. MRN: [id number]  CC:  Chief Complaint  Patient presents with   Medical Management of Chronic Issues    6 month recheck    HPI Helen Rodgers presents for follow up on chronic medical conditions.   Discussed the use of AI scribe software for clinical note transcription with the patient, who gave verbal consent to proceed.  History of Present Illness The patient, with a history of diabetes, presents for a six-month follow-up appointment. She reports struggling with weight loss despite adhering to a strict diet and exercise regimen since February. She expresses disappointment with the lack of results, despite taking Ozempic . The patient also notes increased swelling in her left ankle, which she attributes to a past injury and possible damage. She denies any pain in the leg, but reports occasional knee discomfort. Additionally, the patient has been experiencing acne breakouts, particularly in the T-zone area, which she has not been able to manage effectively with her current skincare routine.    Diabetes, Type 2: -First diagnosed in 2022 -Last A1c 6.1% 10/24 -Medications: Ozempic  decreased to 0.5 mg. Cannot tolerate high doses due to abdominal pain. Abdominal symptoms stable.  -Failed Meds: Metformin or Glipizide  (had a lot of abdominal pain and bloating). -Patient is compliant with the above medications and reports no side effects.  -Exercise: walks, works out on elliptical  -Diet: hard boiled eggs, fruit, small portions, frustrated for lack of weight loss -Eye exam: Due -Foot exam: Due today -Microalbumin: UTD -Statin: None -PNA vaccine: 23 in 2022, will due booster at follow up -Denies symptoms of hypoglycemia, polyuria, polydipsia, numbness extremities, foot ulcers/trauma.   Environmental allergies: -Currently taking Xyzal  5 mg as needed, singular 10 mg at night.  Does  have Flonase and as Astelin nasal spray to use as needed.  Past Medical History:  Diagnosis Date   Allergy    Asthma    Back pain    Dermatitis of external ear    Diabetes mellitus without complication (HCC)     Past Surgical History:  Procedure Laterality Date   ABDOMINAL HYSTERECTOMY     BREAST BIOPSY Left    CHOLECYSTECTOMY     COLONOSCOPY WITH PROPOFOL  N/A 12/29/2022   Procedure: COLONOSCOPY WITH PROPOFOL ;  Surgeon: Selena Daily, MD;  Location: [facility];  Service: Gastroenterology;  Laterality: N/A;   TUBAL LIGATION      Family History  Problem Relation Age of Onset   Hyperlipidemia Mother    Diabetes Mother    Glaucoma Mother    Heart disease Mother    Hyperlipidemia Father    Breast cancer Paternal Aunt     Social History   Socioeconomic History   Marital status: Married    Spouse name: Not on file   Number of children: Not on file   Years of education: Not on file   Highest education level: Some college, no degree  Occupational History   Not on file  Tobacco Use   Smoking status: Never   Smokeless tobacco: Never  Vaping Use   Vaping status: Never Used  Substance and Sexual Activity   Alcohol use: Yes    Alcohol/week: 1.0 standard drink of alcohol    Types: 1 Glasses of wine per week    Comment: occasional   Drug use: Never   Sexual activity: Yes  Other Topics Concern   Not on file  Social  History Narrative   Not on file   Social Drivers of Health   Financial Resource Strain: Low Risk  (01/21/2024)   Overall Financial Resource Strain (CARDIA)    Difficulty of Paying Living Expenses: Not very hard  Food Insecurity: Food Insecurity Present (01/21/2024)   Hunger Vital Sign    Worried About Running Out of Food in the Last Year: Sometimes true    Ran Out of Food in the Last Year: Never true  Transportation Needs: No Transportation Needs (01/21/2024)   PRAPARE - Administrator, Civil Service (Medical): No    Lack of  Transportation (Non-Medical): No  Physical Activity: Insufficiently Active (01/21/2024)   Exercise Vital Sign    Days of Exercise per Week: 3 days    Minutes of Exercise per Session: 30 min  Stress: No Stress Concern Present (01/21/2024)   [employer] of Occupational Health - Occupational Stress Questionnaire    Feeling of Stress : Only a little  Social Connections: Moderately Integrated (01/21/2024)   Social Connection and Isolation Panel [NHANES]    Frequency of Communication with Friends and Family: Twice a week    Frequency of Social Gatherings with Friends and Family: Once a week    Attends Religious Services: More than 4 times per year    Active Member of [employer] or Organizations: No    Attends Banker Meetings: Not on file    Marital Status: Married  Catering manager Violence: Not At Risk (09/22/2020)   Received from [facility], [facility]   Humiliation, Afraid, Rape, and Kick questionnaire    Fear of Current or Ex-Partner: No    Emotionally Abused: No    Physically Abused: No    Sexually Abused: No    ROS Review of Systems  Constitutional:  Negative for chills and fever.  Eyes:  Negative for visual disturbance.  Respiratory:  Negative for shortness of breath.   Cardiovascular:  Negative for chest pain.  Gastrointestinal:  Negative for abdominal pain, constipation, diarrhea, nausea and vomiting.    Objective:   Today's Vitals: BP 124/72 (Cuff Size: Large)   Pulse 80   Temp 97.8 F (36.6 C) (Oral)   Resp 16   Ht 5\' 4"  (1.626 m)   Wt 209 lb (94.8 kg)   SpO2 98%   BMI 35.87 kg/m   Physical Exam Constitutional:      Appearance: Normal appearance.  HENT:     Head: Normocephalic and atraumatic.  Eyes:     Conjunctiva/sclera: Conjunctivae normal.  Cardiovascular:     Rate and Rhythm: Normal rate and regular rhythm.     Pulses:          Dorsalis pedis pulses are 2+ on the right side and 2+ on the left side.  Pulmonary:     Effort:  Pulmonary effort is normal.     Breath sounds: Normal breath sounds.  Musculoskeletal:     Right lower leg: Edema present.     Left lower leg: Edema present.     Right foot: Normal range of motion. No deformity, bunion, Charcot foot, foot drop or prominent metatarsal heads.     Left foot: Normal range of motion. No bunion, Charcot foot, foot drop or prominent metatarsal heads.     Comments: Mild non-pitting edema present   Feet:     Right foot:     Protective Sensation: 6 sites tested.  6 sites sensed.     Skin integrity: Skin integrity normal.  Toenail Condition: Right toenails are normal.     Left foot:     Protective Sensation: 6 sites tested.  6 sites sensed.     Skin integrity: Skin integrity normal.     Toenail Condition: Left toenails are normal.  Skin:    General: Skin is warm and dry.  Neurological:     General: No focal deficit present.     Mental Status: She is alert. Mental status is at baseline.  Psychiatric:        Mood and Affect: Mood normal.        Behavior: Behavior normal.     Assessment & Plan:   Assessment & Plan Type 2 diabetes mellitus Managed with Ozempic . Previous abdominal pain with higher doses. Discussed potential switch to Mounjaro for better tolerance and weight loss. Insurance coverage for Mounjaro uncertain. Risks of increasing Ozempic  include abdominal pain and pancreatitis. - Send prescription for [facility] to [facility] specialty pharmacy to check insurance coverage. - Continue current Ozempic  regimen until Mounjaro is approved and available. - Plan to switch to Mounjaro 5 mg once current Ozempic  supply is finished. - Schedule follow-up in 3 months to assess tolerance and effectiveness of Mounjaro. - Labs due. - Perform diabetic foot exam. - Plan for pneumonia booster in the fall.  Obesity Managed with Ozempic . No significant weight loss despite dietary efforts. Discussed switch to Mounjaro for better weight management and A1c control. - Switch  to Mounjaro as discussed under Type 2 diabetes mellitus.  Allergic rhinitis Managed with Xyzal  and Singulair . Current regimen effective. - Refill Xyzal  and Singulair  prescriptions.  Acne Recent increase in acne in T-zone. Discussed benzoyl peroxide and pimple patches with microneedling. - Recommend over-the-counter benzoyl peroxide for spot treatment. - Consider pimple patches with microneedling for persistent acne.  - CBC w/Diff/Platelet - COMPLETE METABOLIC PANEL WITHOUT GFR - HgB A1c - HM Diabetes Foot Exam - tirzepatide (MOUNJARO) 5 MG/0.5ML Pen; Inject 5 mg into the skin once a week.  Dispense: 2 mL; Refill: 1 - Lipid Profile - levocetirizine (XYZAL ) 5 MG tablet; Take 1 tablet (5 mg total) by mouth every evening.  Dispense: 90 tablet; Refill: 1 - montelukast  (SINGULAIR ) 10 MG tablet; Take 1 tablet (10 mg total) by mouth at bedtime.  Dispense: 90 tablet; Refill: 1 - albuterol  (VENTOLIN  HFA) 108 (90 Base) MCG/ACT inhaler; Inhale 1-2 puffs into the lungs every 6 (six) hours as needed.  Dispense: 1 each; Refill: 3 - Vitamin D (25 hydroxy)   Follow-up: Return in about 3 months (around 04/25/2024).   Rockney Cid, DO

## 2024-01-26 LAB — CBC WITH DIFFERENTIAL/PLATELET
Absolute Lymphocytes: 3824 {cells}/uL (ref 850–3900)
Absolute Monocytes: 419 {cells}/uL (ref 200–950)
Basophils Absolute: 40 {cells}/uL (ref 0–200)
Basophils Relative: 0.5 %
Eosinophils Absolute: 126 {cells}/uL (ref 15–500)
Eosinophils Relative: 1.6 %
HCT: 40.7 % (ref 35.0–45.0)
Hemoglobin: 13.2 g/dL (ref 11.7–15.5)
MCH: 30.5 pg (ref 27.0–33.0)
MCHC: 32.4 g/dL (ref 32.0–36.0)
MCV: 94 fL (ref 80.0–100.0)
MPV: 9.3 fL (ref 7.5–12.5)
Monocytes Relative: 5.3 %
Neutro Abs: 3492 {cells}/uL (ref 1500–7800)
Neutrophils Relative %: 44.2 %
Platelets: 322 10*3/uL (ref 140–400)
RBC: 4.33 10*6/uL (ref 3.80–5.10)
RDW: 12.1 % (ref 11.0–15.0)
Total Lymphocyte: 48.4 %
WBC: 7.9 10*3/uL (ref 3.8–10.8)

## 2024-01-26 LAB — COMPLETE METABOLIC PANEL WITHOUT GFR
AG Ratio: 1.3 (calc) (ref 1.0–2.5)
ALT: 30 U/L — ABNORMAL HIGH (ref 6–29)
AST: 28 U/L (ref 10–35)
Albumin: 4.1 g/dL (ref 3.6–5.1)
Alkaline phosphatase (APISO): 107 U/L (ref 37–153)
BUN: 9 mg/dL (ref 7–25)
CO2: 29 mmol/L (ref 20–32)
Calcium: 9 mg/dL (ref 8.6–10.4)
Chloride: 105 mmol/L (ref 98–110)
Creat: 0.53 mg/dL (ref 0.50–1.03)
Globulin: 3.2 g/dL (ref 1.9–3.7)
Glucose, Bld: 109 mg/dL — ABNORMAL HIGH (ref 65–99)
Potassium: 3.9 mmol/L (ref 3.5–5.3)
Sodium: 141 mmol/L (ref 135–146)
Total Bilirubin: 0.6 mg/dL (ref 0.2–1.2)
Total Protein: 7.3 g/dL (ref 6.1–8.1)

## 2024-01-26 LAB — LIPID PANEL
Cholesterol: 205 mg/dL — ABNORMAL HIGH (ref <200)
HDL: 46 mg/dL — ABNORMAL LOW (ref >=50)
LDL Cholesterol (Calc): 136 mg/dL — ABNORMAL HIGH
Non-HDL Cholesterol (Calc): 159 mg/dL — ABNORMAL HIGH (ref <130)
Total CHOL/HDL Ratio: 4.5 (calc) (ref <5.0)
Triglycerides: 118 mg/dL (ref <150)

## 2024-01-26 LAB — HEMOGLOBIN A1C
Hgb A1c MFr Bld: 6.3 % — ABNORMAL HIGH (ref <5.7)
Mean Plasma Glucose: 134 mg/dL
eAG (mmol/L): 7.4 mmol/L

## 2024-01-26 LAB — VITAMIN D 25 HYDROXY (VIT D DEFICIENCY, FRACTURES): Vit D, 25-Hydroxy: 22 ng/mL — ABNORMAL LOW (ref 30–100)

## 2024-01-28 ENCOUNTER — Encounter: Payer: Self-pay | Admitting: Internal Medicine

## 2024-01-28 MED ORDER — VITAMIN D (ERGOCALCIFEROL) 1.25 MG (50000 UNIT) PO CAPS: 50000.0000 [IU] | ORAL_CAPSULE | ORAL | 0 refills | Status: DC

## 2024-01-28 NOTE — Addendum Note (Signed)
 Addended by: Rockney Cid on: 01/28/2024 08:08 AM   Modules accepted: Orders

## 2024-02-15 NOTE — Telephone Encounter (Signed)
 Copied from CRM [id number]. Topic: Clinical - Medication Prior Auth >> Feb 15, 2024  1:59 PM Hamp Levine R wrote: Reason for CRM: Patient is calling to see if [facility] has sent over the request for Prior Authorization for tirzepatide (MOUNJARO) 5 MG/0.5ML Pen  [facility] - [place], [place] - [address] [place] [address] Phone: [phone] Fax: [phone]  Patient can be reached at [phone]

## 2024-02-27 ENCOUNTER — Telehealth: Payer: Self-pay | Admitting: Pharmacy Technician

## 2024-02-27 ENCOUNTER — Other Ambulatory Visit (HOSPITAL_COMMUNITY): Payer: Self-pay

## 2024-02-27 NOTE — Telephone Encounter (Signed)
 Pharmacy Patient Advocate Encounter   Received notification from Patient Advice Request messages that prior authorization for MOUNJARO 5MG  is required/requested.   Insurance verification completed.   The patient is insured through [employer] .   Per test claim: Refill too soon. PA is not needed at this time. Medication was filled 02/26/24. Next eligible fill date is 03/18/24.   I called pt's plan this morning and the PA was approved 02/21/24-02/20/25

## 2024-02-27 NOTE — Telephone Encounter (Signed)
 Good morning her PA was approved 02/21/24 through 02/20/25

## 2024-03-04 ENCOUNTER — Encounter: Payer: Self-pay | Admitting: Internal Medicine

## 2024-04-21 ENCOUNTER — Other Ambulatory Visit: Payer: Self-pay | Admitting: Internal Medicine

## 2024-04-21 DIAGNOSIS — E1165 Type 2 diabetes mellitus with hyperglycemia: Secondary | ICD-10-CM

## 2024-04-22 ENCOUNTER — Other Ambulatory Visit: Payer: Self-pay | Admitting: Internal Medicine

## 2024-04-22 DIAGNOSIS — E1165 Type 2 diabetes mellitus with hyperglycemia: Secondary | ICD-10-CM

## 2024-04-22 NOTE — Telephone Encounter (Unsigned)
 Copied from CRM [id number]. Topic: Clinical - Medication Refill >> Apr 22, 2024 11:42 AM Helen Rodgers wrote: Medication: tirzepatide  (MOUNJARO ) 5 MG/0.5ML Pen [[id number]]   Has the patient contacted their pharmacy? Yes (Agent: If no, request that the patient contact the pharmacy for the refill. If patient does not wish to contact the pharmacy document the reason why and proceed with request.) (Agent: If yes, when and what did the pharmacy advise?)  This is the patient's preferred pharmacy:  [facility] [place], [place] - [address] [place] [address] Phone: [phone] Fax: [phone]  Is this the correct pharmacy for this prescription? Yes If no, delete pharmacy and type the correct one.   Has the prescription been filled recently? Yes  Is the patient out of the medication? Yes  Has the patient been seen for an appointment in the last year OR does the patient have an upcoming appointment? Yes  Can we respond through MyChart? No  Agent: Please be advised that Rx refills may take up to 3 business days. We ask that you follow-up with your pharmacy.

## 2024-04-22 NOTE — Telephone Encounter (Signed)
 Last OV 01/25/24 by Dr. Bernardo

## 2024-04-23 NOTE — Telephone Encounter (Signed)
 Requested medication (s) are due for refill today: yes  Requested medication (s) are on the active medication list: yes  Last refill:  01/25/24  Future visit scheduled: yes  Notes to clinic:  Medication not assigned to a protocol, review manually.      Requested Prescriptions  Pending Prescriptions Disp Refills   MOUNJARO  5 MG/0.5ML Pen [Pharmacy Med Name: Mounjaro  5 mg/0.5 mL subcutaneous pen injector (tirzepatide )] 2 mL 1    Sig: Inject 5 mg under the skin once a week.     Off-Protocol Failed - 04/23/2024 11:33 AM      Failed - Medication not assigned to a protocol, review manually.      Passed - Valid encounter within last 12 months    Recent Outpatient Visits           2 months ago Type 2 diabetes mellitus with hyperglycemia, without long-term current use of insulin [facility])   [place] [facility] Bernardo Fend, DO       Future Appointments             In 1 week Bernardo Fend, DO [facility], [facility]

## 2024-04-30 ENCOUNTER — Encounter: Payer: Self-pay | Admitting: Internal Medicine

## 2024-04-30 ENCOUNTER — Other Ambulatory Visit: Payer: Self-pay

## 2024-04-30 ENCOUNTER — Ambulatory Visit: Admitting: Internal Medicine

## 2024-04-30 VITALS — BP 122/78 | HR 65 | Temp 97.8°F | Resp 16 | Ht 64.0 in | Wt 204.1 lb

## 2024-04-30 DIAGNOSIS — F419 Anxiety disorder, unspecified: Secondary | ICD-10-CM

## 2024-04-30 DIAGNOSIS — E782 Mixed hyperlipidemia: Secondary | ICD-10-CM

## 2024-04-30 DIAGNOSIS — E1165 Type 2 diabetes mellitus with hyperglycemia: Secondary | ICD-10-CM | POA: Diagnosis not present

## 2024-04-30 DIAGNOSIS — R300 Dysuria: Secondary | ICD-10-CM | POA: Diagnosis not present

## 2024-04-30 DIAGNOSIS — E559 Vitamin D deficiency, unspecified: Secondary | ICD-10-CM

## 2024-04-30 DIAGNOSIS — N3090 Cystitis, unspecified without hematuria: Secondary | ICD-10-CM

## 2024-04-30 LAB — POCT GLYCOSYLATED HEMOGLOBIN (HGB A1C): Hemoglobin A1C: 6 % — AB (ref 4.0–5.6)

## 2024-04-30 LAB — POCT URINALYSIS DIPSTICK
Bilirubin, UA: NEGATIVE
Glucose, UA: NEGATIVE
Ketones, UA: NEGATIVE
Protein, UA: POSITIVE — AB
Spec Grav, UA: 1.02 (ref 1.010–1.025)
Urobilinogen, UA: 0.2 U/dL
pH, UA: 6 (ref 5.0–8.0)

## 2024-04-30 MED ORDER — NITROFURANTOIN MONOHYD MACRO 100 MG PO CAPS: 100.0000 mg | ORAL_CAPSULE | Freq: Two times a day (BID) | ORAL | 0 refills | Status: AC

## 2024-04-30 MED ORDER — HYDROXYZINE HCL 10 MG PO TABS
10.0000 mg | ORAL_TABLET | Freq: Three times a day (TID) | ORAL | 1 refills | Status: AC | PRN
Start: 2024-04-30 — End: ?

## 2024-04-30 NOTE — Progress Notes (Signed)
 Established Patient Office Visit  Subjective:  Patient ID: Helen Rodgers, female    DOB: 1971/07/14  Age: 53 y.o. MRN: [id number]  CC:  Chief Complaint  Patient presents with   Medical Management of Chronic Issues    3 month recheck    HPI Helen Rodgers presents for follow up on chronic medical conditions.   Discussed the use of AI scribe software for clinical note transcription with the patient, who gave verbal consent to proceed.  History of Present Illness Helen Rodgers is a 53 year old female with diabetes who presents with gastrointestinal symptoms related to Mounjaro  use.  She experiences diarrhea with each meal since starting Mounjaro , without nausea or vomiting. Her appetite is reduced, leading to a five-pound weight loss since her last visit. She typically eats a bowl of cereal or fruit for breakfast and a smaller lunch. Gallbladder surgery in 2006 results in discomfort if she delays eating. Working from home aids in managing her symptoms. Omeprazole is used occasionally for stomach issues, providing relief. A delay in her Mounjaro  prescription refill led to a missed dose last Friday, but she has since resumed her medication. Anxiety is managed with hydroxyzine , particularly during her mother's health issues. She occasionally experiences symptoms suggestive of a UTI, which resolve without treatment.   Diabetes, Type 2: -First diagnosed in 2022 -Last A1c 6.3% 4/25 -Medications: Now on Mounjaro  5 mg -Failed Meds: Metformin or Glipizide  (had a lot of abdominal pain and bloating). No longer on Ozempic , caused stomach pains -Patient is compliant with the above medications and reports no side effects.  -Exercise: walks, works out on elliptical  -Diet: hard boiled eggs, fruit, small portions, frustrated for lack of weight loss -Eye exam: Due -Foot exam: UTD -Microalbumin: UTD -Statin: None -PNA vaccine: 23 in 2022, will due booster at follow up -Denies symptoms of hypoglycemia,  polyuria, polydipsia, numbness extremities, foot ulcers/trauma.   Environmental allergies: -Currently taking Xyzal  5 mg as needed, singular 10 mg at night.  Does have Flonase and as Astelin nasal spray to use as needed.  Past Medical History:  Diagnosis Date   Allergy    Asthma    Back pain    Dermatitis of external ear    Diabetes mellitus without complication (HCC)     Past Surgical History:  Procedure Laterality Date   ABDOMINAL HYSTERECTOMY     BREAST BIOPSY Left    CHOLECYSTECTOMY     COLONOSCOPY WITH PROPOFOL  N/A 12/29/2022   Procedure: COLONOSCOPY WITH PROPOFOL ;  Surgeon: Unk Corinn Skiff, MD;  Location: [facility] ENDOSCOPY;  Service: Gastroenterology;  Laterality: N/A;   TUBAL LIGATION      Family History  Problem Relation Age of Onset   Hyperlipidemia Mother    Diabetes Mother    Glaucoma Mother    Heart disease Mother    Hyperlipidemia Father    Breast cancer Paternal Aunt     Social History   Socioeconomic History   Marital status: Married    Spouse name: Not on file   Number of children: Not on file   Years of education: Not on file   Highest education level: Some college, no degree  Occupational History   Not on file  Tobacco Use   Smoking status: Never   Smokeless tobacco: Never  Vaping Use   Vaping status: Never Used  Substance and Sexual Activity   Alcohol use: Yes    Alcohol/week: 1.0 standard drink of alcohol    Types: 1 Glasses of wine per  week    Comment: occasional   Drug use: Never   Sexual activity: Yes  Other Topics Concern   Not on file  Social History Narrative   Not on file   Social Drivers of Health   Financial Resource Strain: Low Risk  (04/29/2024)   Overall Financial Resource Strain (CARDIA)    Difficulty of Paying Living Expenses: Not very hard  Food Insecurity: No Food Insecurity (04/29/2024)   Hunger Vital Sign    Worried About Running Out of Food in the Last Year: Never true    Ran Out of Food in the Last Year: Never  true  Transportation Needs: No Transportation Needs (04/29/2024)   PRAPARE - Administrator, Civil Service (Medical): No    Lack of Transportation (Non-Medical): No  Physical Activity: Insufficiently Active (04/29/2024)   Exercise Vital Sign    Days of Exercise per Week: 2 days    Minutes of Exercise per Session: 10 min  Stress: No Stress Concern Present (04/29/2024)   [employer] of Occupational Health - Occupational Stress Questionnaire    Feeling of Stress: Only a little  Social Connections: Moderately Integrated (04/29/2024)   Social Connection and Isolation Panel    Frequency of Communication with Friends and Family: More than three times a week    Frequency of Social Gatherings with Friends and Family: Twice a week    Attends Religious Services: 1 to 4 times per year    Active Member of [employer] or Organizations: No    Attends Engineer, structural: Not on file    Marital Status: Married  Catering manager Violence: Not At Risk (09/22/2020)   [facility] from [facility]   Humiliation, Afraid, Rape, and Kick questionnaire    Within the last year, have you been afraid of your partner or ex-partner?: No    Within the last year, have you been humiliated or emotionally abused in other ways by your partner or ex-partner?: No    Within the last year, have you been kicked, hit, slapped, or otherwise physically hurt by your partner or ex-partner?: No    Within the last year, have you been raped or forced to have any kind of sexual activity by your partner or ex-partner?: No    ROS Review of Systems  Constitutional:  Negative for chills and fever.  Eyes:  Negative for visual disturbance.  Gastrointestinal:  Positive for diarrhea. Negative for abdominal pain, constipation, nausea and vomiting.    Objective:   Today's Vitals: BP 122/78 (Cuff Size: Large)   Pulse 65   Temp 97.8 F (36.6 C) (Oral)   Resp 16   Ht 5' 4 (1.626 m)   Wt 204 lb 1.6 oz (92.6 kg)    SpO2 98%   BMI 35.03 kg/m   Physical Exam Constitutional:      Appearance: Normal appearance.  HENT:     Head: Normocephalic and atraumatic.  Eyes:     Conjunctiva/sclera: Conjunctivae normal.  Cardiovascular:     Rate and Rhythm: Normal rate and regular rhythm.  Pulmonary:     Effort: Pulmonary effort is normal.     Breath sounds: Normal breath sounds.  Skin:    General: Skin is warm and dry.  Neurological:     General: No focal deficit present.     Mental Status: She is alert. Mental status is at baseline.  Psychiatric:        Mood and Affect: Mood normal.  Behavior: Behavior normal.     Assessment & Plan:   Assessment & Plan Type 2 diabetes mellitus  Type 2 diabetes managed with Mounjaro , causing postprandial diarrhea. Mounjaro  better tolerated than Ozempic . Weight loss of 5 pounds. A1c previously good, even better today at 6.0%. Current dose effective, increasing may worsen symptoms. - Continue Mounjaro  at current dose. - Advise dietary changes: increase protein intake, consider fiber supplements, avoid high-fat foods. - Refill Mounjaro  prescription for two months.  Hyperlipidemia Hyperlipidemia with improved cholesterol levels. Total cholesterol decreased, HDL normal, triglycerides under 150, LDL decreased. Cardiovascular risk low borderline. Family history of early heart disease. Management focuses on dietary modifications. - Advise dietary modifications: reduce red meats and fatty processed foods, increase lean proteins like chicken, [place], and fish. - Monitor cholesterol levels annually.  Vitamin D  deficiency Vitamin D  deficiency previously treated with high-dose prescription. Current course ending soon, requiring transition to maintenance dose. - Switch to over-the-counter vitamin D  supplement after completing current prescription.  Anxiety Anxiety exacerbated by caregiving responsibilities. Hydroxyzine  previously prescribed. Recent increase in anxiety  due to mother's health issues. - Refill hydroxyzine  prescription with 90 tablets and a refill.  Urinary tract infection Symptoms suggestive of urinary tract infection, including increased frequency and sensation of onset without persistent symptoms. Differential includes inflammation without infection. - Order urinalysis to check for urinary tract infection. UA showing nitrates and leukocytes, will send for culture and start antibiotics.   - POCT HgB A1C - POCT Urinalysis Dipstick - nitrofurantoin , macrocrystal-monohydrate, (MACROBID ) 100 MG capsule; Take 1 capsule (100 mg total) by mouth 2 (two) times daily for 5 days.  Dispense: 10 capsule; Refill: 0 - hydrOXYzine  (ATARAX ) 10 MG tablet; Take 1 tablet (10 mg total) by mouth 3 (three) times daily as needed for anxiety.  Dispense: 90 tablet; Refill: 1    Follow-up: Return in about 6 months (around 10/31/2024).   Sharyle Fischer, DO

## 2024-05-01 LAB — URINE CULTURE
MICRO NUMBER:: 16767379
Result:: NO GROWTH
SPECIMEN QUALITY:: ADEQUATE

## 2024-05-02 ENCOUNTER — Ambulatory Visit: Payer: Self-pay | Admitting: Internal Medicine

## 2024-06-06 ENCOUNTER — Other Ambulatory Visit: Payer: Self-pay | Admitting: Internal Medicine

## 2024-06-06 DIAGNOSIS — Z1231 Encounter for screening mammogram for malignant neoplasm of breast: Secondary | ICD-10-CM

## 2024-07-07 ENCOUNTER — Encounter: Payer: Self-pay | Admitting: Internal Medicine

## 2024-07-08 ENCOUNTER — Other Ambulatory Visit: Payer: Self-pay | Admitting: Internal Medicine

## 2024-07-08 DIAGNOSIS — E1165 Type 2 diabetes mellitus with hyperglycemia: Secondary | ICD-10-CM

## 2024-07-10 ENCOUNTER — Ambulatory Visit: Admitting: Internal Medicine

## 2024-07-10 ENCOUNTER — Encounter: Payer: Self-pay | Admitting: Internal Medicine

## 2024-07-10 VITALS — BP 130/76 | HR 84 | Temp 98.0°F | Resp 16 | Ht 64.0 in | Wt 201.7 lb

## 2024-07-10 DIAGNOSIS — R350 Frequency of micturition: Secondary | ICD-10-CM | POA: Diagnosis not present

## 2024-07-10 DIAGNOSIS — M5442 Lumbago with sciatica, left side: Secondary | ICD-10-CM | POA: Diagnosis not present

## 2024-07-10 DIAGNOSIS — K649 Unspecified hemorrhoids: Secondary | ICD-10-CM

## 2024-07-10 LAB — POCT URINALYSIS DIPSTICK
Bilirubin, UA: NEGATIVE
Blood, UA: NEGATIVE
Glucose, UA: NEGATIVE
Ketones, UA: NEGATIVE
Nitrite, UA: NEGATIVE
Protein, UA: NEGATIVE
Spec Grav, UA: 1.02 (ref 1.010–1.025)
Urobilinogen, UA: 0.2 U/dL
pH, UA: 5 (ref 5.0–8.0)

## 2024-07-10 MED ORDER — NAPROXEN 500 MG PO TABS: 500.0000 mg | ORAL_TABLET | Freq: Two times a day (BID) | ORAL | 0 refills | Status: AC

## 2024-07-10 NOTE — Telephone Encounter (Signed)
 Requested medication (s) are due for refill today: yes  Requested medication (s) are on the active medication list: yes  Last refill:  04/23/24  Future visit scheduled: yes  Notes to clinic:   Medication not assigned to a protocol, review manually.     Requested Prescriptions  Pending Prescriptions Disp Refills   MOUNJARO  5 MG/0.5ML Pen [Pharmacy Med Name: Mounjaro  5 mg/0.5 mL subcutaneous pen injector (tirzepatide )] 2 mL 1    Sig: Inject 5 mg under the skin once a week.     Off-Protocol Failed - 07/10/2024  1:49 PM      Failed - Medication not assigned to a protocol, review manually.      Passed - Valid encounter within last 12 months    Recent Outpatient Visits           2 months ago Type 2 diabetes mellitus with hyperglycemia, without long-term current use of insulin [facility])   [place] [facility] Bernardo Fend, DO   5 months ago Type 2 diabetes mellitus with hyperglycemia, without long-term current use of insulin [facility])   [place] [facility] Bernardo Fend, DO       Future Appointments             In 3 months Bernardo Fend, DO [facility], [place]

## 2024-07-10 NOTE — Progress Notes (Addendum)
 Acute Office Visit  Subjective:     Patient ID: Helen Rodgers, female    DOB: [birth year], 53 y.o.   MRN: [id number]  Chief Complaint  Patient presents with   Urinary Frequency    HPI Patient is in today for urinary frequency.   Discussed the use of AI scribe software for clinical note transcription with the patient, who gave verbal consent to proceed.  History of Present Illness Helen Rodgers is a 53 year old female who presents with back pain and urinary symptoms.  She experiences urinary symptoms similar to a urinary tract infection without burning, starting after completing antibiotics a month ago. Recent urinalysis shows trace leukocytes and negative nitrates.  She has significant back pain that began last week after feeling something 'bulging out' when sitting down. The pain is persistent, radiates slightly, and affects daily activities. Heat pad, Flexeril , and ibuprofen  have not provided relief. She has a history of sciatica and typically uses these medications.  She describes a sensation of fullness in the rectal area, associated with hemorrhoids, managed with hemorrhoid cream. She noticed trace mucus in her stool on the same day the back pain started, without bleeding or significant rectal pain.  She is taking Mounjaro , with the last dose on Saturday, and requests a refill.    Review of Systems  Constitutional:  Negative for chills and fever.  Genitourinary:  Positive for frequency and urgency. Negative for dysuria and hematuria.  Musculoskeletal:  Positive for back pain.        Objective:    BP 130/76 (Cuff Size: Large)   Pulse 84   Temp 98 F (36.7 C) (Oral)   Resp 16   Ht 5' 4 (1.626 m)   Wt 201 lb 11.2 oz (91.5 kg)   SpO2 99%   BMI 34.62 kg/m  BP Readings from Last 3 Encounters:  07/10/24 130/76  04/30/24 122/78  01/25/24 124/72   Wt Readings from Last 3 Encounters:  07/10/24 201 lb 11.2 oz (91.5 kg)  04/30/24 204 lb 1.6 oz (92.6 kg)  01/25/24 209  lb (94.8 kg)      Physical Exam Constitutional:      Appearance: Normal appearance.  HENT:     Head: Normocephalic and atraumatic.  Eyes:     Conjunctiva/sclera: Conjunctivae normal.  Cardiovascular:     Rate and Rhythm: Normal rate and regular rhythm.  Pulmonary:     Effort: Pulmonary effort is normal.     Breath sounds: Normal breath sounds.  Skin:    General: Skin is warm and dry.  Neurological:     General: No focal deficit present.     Mental Status: She is alert. Mental status is at baseline.  Psychiatric:        Mood and Affect: Mood normal.        Behavior: Behavior normal.     Results for orders placed or performed in visit on 07/10/24  POCT urinalysis dipstick  Result Value Ref Range   Color, UA yellow    Clarity, UA cloudy    Glucose, UA Negative Negative   Bilirubin, UA negative    Ketones, UA negative    Spec Grav, UA 1.020 1.010 - 1.025   Blood, UA negative    pH, UA 5.0 5.0 - 8.0   Protein, UA Negative Negative   Urobilinogen, UA 0.2 0.2 or 1.0 E.U./dL   Nitrite, UA negative    Leukocytes, UA Trace (A) Negative   Appearance cloudy    Odor  none         Assessment & Plan:   Assessment & Plan Low back pain with sciatica Chronic low back pain with sciatica, exacerbated during recent vacation. Pain radiates with leg numbness. Persistent for a week despite self-care. - Prescribe naproxen  twice daily for 10 days. - Advise continuation of current self-care measures including heat application and stretching. - Discuss potential for physical therapy if symptoms persist.  Hemorrhoids Intermittent hemorrhoidal symptoms with fullness and discomfort, exacerbated by recent activities. No bleeding reported. - Advise use of Preparation H cream. - Recommend follow-up if symptoms do not improve or worsen.  Urinary frequency Recent onset of urinary frequency with trace leukocytes, negative nitrates. Differential includes possible yeast infection. Urine  culture pending. - Send urine sample for culture. - Consider vaginal swab for yeast infection if culture is negative and symptoms persist.  - naproxen  (NAPROSYN ) 500 MG tablet; Take 1 tablet (500 mg total) by mouth 2 (two) times daily with a meal for 10 days.  Dispense: 20 tablet; Refill: 0 - POCT urinalysis dipstick - Urine Culture  Return for already scheduled.  Sharyle Fischer, DO

## 2024-07-11 LAB — URINE CULTURE
MICRO NUMBER:: 17079893
Result:: NO GROWTH
SPECIMEN QUALITY:: ADEQUATE

## 2024-07-13 ENCOUNTER — Ambulatory Visit: Payer: Self-pay | Admitting: Internal Medicine

## 2024-07-21 ENCOUNTER — Other Ambulatory Visit: Payer: Self-pay | Admitting: Medical Genetics

## 2024-07-28 ENCOUNTER — Ambulatory Visit
Admission: RE | Admit: 2024-07-28 | Discharge: 2024-07-28 | Disposition: A | Discharge status: Home / Self Care | Source: Ambulatory Visit | Attending: Internal Medicine | Admitting: Internal Medicine

## 2024-07-28 DIAGNOSIS — Z1231 Encounter for screening mammogram for malignant neoplasm of breast: Secondary | ICD-10-CM | POA: Insufficient documentation

## 2024-07-31 ENCOUNTER — Other Ambulatory Visit: Payer: Self-pay | Admitting: Internal Medicine

## 2024-07-31 DIAGNOSIS — R928 Other abnormal and inconclusive findings on diagnostic imaging of breast: Secondary | ICD-10-CM

## 2024-08-05 ENCOUNTER — Ambulatory Visit
Admission: RE | Admit: 2024-08-05 | Discharge: 2024-08-05 | Disposition: A | Discharge status: Home / Self Care | Source: Ambulatory Visit | Attending: Internal Medicine | Admitting: Internal Medicine

## 2024-08-05 DIAGNOSIS — R928 Other abnormal and inconclusive findings on diagnostic imaging of breast: Secondary | ICD-10-CM | POA: Insufficient documentation

## 2024-08-06 ENCOUNTER — Ambulatory Visit: Payer: Self-pay | Admitting: Internal Medicine

## 2024-10-09 ENCOUNTER — Other Ambulatory Visit: Payer: Self-pay | Admitting: Medical Genetics

## 2024-10-09 DIAGNOSIS — Z006 Encounter for examination for normal comparison and control in clinical research program: Secondary | ICD-10-CM

## 2024-10-31 ENCOUNTER — Other Ambulatory Visit: Payer: Self-pay

## 2024-10-31 ENCOUNTER — Encounter: Payer: Self-pay | Admitting: Internal Medicine

## 2024-10-31 ENCOUNTER — Ambulatory Visit: Admitting: Internal Medicine

## 2024-10-31 VITALS — BP 112/70 | HR 74 | Temp 98.1°F | Resp 16 | Ht 64.0 in | Wt 190.9 lb

## 2024-10-31 DIAGNOSIS — K5903 Drug induced constipation: Secondary | ICD-10-CM

## 2024-10-31 DIAGNOSIS — E1165 Type 2 diabetes mellitus with hyperglycemia: Secondary | ICD-10-CM | POA: Diagnosis not present

## 2024-10-31 DIAGNOSIS — E559 Vitamin D deficiency, unspecified: Secondary | ICD-10-CM | POA: Diagnosis not present

## 2024-10-31 DIAGNOSIS — Z7985 Long-term (current) use of injectable non-insulin antidiabetic drugs: Secondary | ICD-10-CM | POA: Diagnosis not present

## 2024-10-31 DIAGNOSIS — E66811 Obesity, class 1: Secondary | ICD-10-CM

## 2024-10-31 DIAGNOSIS — E669 Obesity, unspecified: Secondary | ICD-10-CM

## 2024-10-31 LAB — POCT GLYCOSYLATED HEMOGLOBIN (HGB A1C): Hemoglobin A1C: 5.4 % (ref 4.0–5.6)

## 2024-10-31 MED ORDER — MOUNJARO 5 MG/0.5ML ~~LOC~~ SOAJ: 5.0000 mg | SUBCUTANEOUS | 1 refills | Status: AC

## 2024-10-31 NOTE — Patient Instructions (Addendum)
 It was great seeing you today!  Plan discussed at today's visit: -Increase water intake and fiber in the diet, also recommend Vitafusion fiber supplement  Follow up in: 3 months   Take care and let us  know if you have any questions or concerns prior to your next visit.  Dr. Bernardo

## 2024-11-01 LAB — MICROALBUMIN / CREATININE URINE RATIO
Creatinine, Urine: 158 mg/dL (ref 20–275)
Microalb Creat Ratio: 9 mg/g{creat}
Microalb, Ur: 1.5 mg/dL

## 2024-11-03 ENCOUNTER — Ambulatory Visit: Payer: Self-pay | Admitting: Internal Medicine

## 2025-02-06 ENCOUNTER — Ambulatory Visit: Admitting: Internal Medicine
